# Patient Record
Sex: Female | Born: 1945
Health system: Southern US, Community
[De-identification: ages and names within clinical notes are randomized; demographics above are authoritative.]

## PROBLEM LIST (undated history)

## (undated) DIAGNOSIS — I10 Essential (primary) hypertension: Secondary | ICD-10-CM

## (undated) DIAGNOSIS — E785 Hyperlipidemia, unspecified: Secondary | ICD-10-CM

## (undated) HISTORY — DX: Essential (primary) hypertension: I10

## (undated) HISTORY — DX: Hyperlipidemia, unspecified: E78.5

---

## 1989-05-13 HISTORY — PX: ABDOMINAL HYSTERECTOMY: SHX81

## 2014-06-16 DIAGNOSIS — I10 Essential (primary) hypertension: Secondary | ICD-10-CM | POA: Diagnosis not present

## 2014-08-15 DIAGNOSIS — Z1231 Encounter for screening mammogram for malignant neoplasm of breast: Secondary | ICD-10-CM | POA: Diagnosis not present

## 2015-02-20 DIAGNOSIS — I10 Essential (primary) hypertension: Secondary | ICD-10-CM | POA: Diagnosis not present

## 2015-03-15 DIAGNOSIS — H2513 Age-related nuclear cataract, bilateral: Secondary | ICD-10-CM | POA: Diagnosis not present

## 2015-03-15 DIAGNOSIS — H40033 Anatomical narrow angle, bilateral: Secondary | ICD-10-CM | POA: Diagnosis not present

## 2015-03-15 DIAGNOSIS — Z23 Encounter for immunization: Secondary | ICD-10-CM | POA: Diagnosis not present

## 2015-06-22 DIAGNOSIS — E782 Mixed hyperlipidemia: Secondary | ICD-10-CM | POA: Diagnosis not present

## 2015-06-22 DIAGNOSIS — I10 Essential (primary) hypertension: Secondary | ICD-10-CM | POA: Diagnosis not present

## 2015-06-22 DIAGNOSIS — R531 Weakness: Secondary | ICD-10-CM | POA: Diagnosis not present

## 2015-06-22 DIAGNOSIS — Z79899 Other long term (current) drug therapy: Secondary | ICD-10-CM | POA: Diagnosis not present

## 2015-06-22 DIAGNOSIS — E7800 Pure hypercholesterolemia, unspecified: Secondary | ICD-10-CM | POA: Diagnosis not present

## 2015-06-22 DIAGNOSIS — R5381 Other malaise: Secondary | ICD-10-CM | POA: Diagnosis not present

## 2015-08-04 DIAGNOSIS — R300 Dysuria: Secondary | ICD-10-CM | POA: Diagnosis not present

## 2015-08-04 DIAGNOSIS — N3090 Cystitis, unspecified without hematuria: Secondary | ICD-10-CM | POA: Diagnosis not present

## 2015-08-17 DIAGNOSIS — Z1231 Encounter for screening mammogram for malignant neoplasm of breast: Secondary | ICD-10-CM | POA: Diagnosis not present

## 2015-10-18 DIAGNOSIS — I10 Essential (primary) hypertension: Secondary | ICD-10-CM | POA: Diagnosis not present

## 2015-10-26 DIAGNOSIS — N959 Unspecified menopausal and perimenopausal disorder: Secondary | ICD-10-CM | POA: Diagnosis not present

## 2016-02-15 DIAGNOSIS — R531 Weakness: Secondary | ICD-10-CM | POA: Diagnosis not present

## 2016-02-15 DIAGNOSIS — R5381 Other malaise: Secondary | ICD-10-CM | POA: Diagnosis not present

## 2016-02-15 DIAGNOSIS — E785 Hyperlipidemia, unspecified: Secondary | ICD-10-CM | POA: Diagnosis not present

## 2016-02-15 DIAGNOSIS — Z79899 Other long term (current) drug therapy: Secondary | ICD-10-CM | POA: Diagnosis not present

## 2016-02-15 DIAGNOSIS — I10 Essential (primary) hypertension: Secondary | ICD-10-CM | POA: Diagnosis not present

## 2016-03-12 DIAGNOSIS — Z23 Encounter for immunization: Secondary | ICD-10-CM | POA: Diagnosis not present

## 2016-03-13 DIAGNOSIS — H40033 Anatomical narrow angle, bilateral: Secondary | ICD-10-CM | POA: Diagnosis not present

## 2016-03-13 DIAGNOSIS — H2513 Age-related nuclear cataract, bilateral: Secondary | ICD-10-CM | POA: Diagnosis not present

## 2016-03-13 LAB — HM DIABETES EYE EXAM

## 2016-07-26 ENCOUNTER — Ambulatory Visit (INDEPENDENT_AMBULATORY_CARE_PROVIDER_SITE_OTHER): Payer: Medicare Other | Admitting: Family Medicine

## 2016-07-26 ENCOUNTER — Encounter: Payer: Self-pay | Admitting: Family Medicine

## 2016-07-26 DIAGNOSIS — E785 Hyperlipidemia, unspecified: Secondary | ICD-10-CM | POA: Diagnosis not present

## 2016-07-26 DIAGNOSIS — I10 Essential (primary) hypertension: Secondary | ICD-10-CM | POA: Diagnosis not present

## 2016-07-26 MED ORDER — METOPROLOL SUCCINATE ER 25 MG PO TB24: 25.0000 mg | ORAL_TABLET | Freq: Every day | ORAL | 3 refills | Status: DC

## 2016-07-26 NOTE — Progress Notes (Signed)
   HPI  Patient presents today here to establish care.  Patient has hypertension and hyperlipidemia.  Tolerating Lipitor without a problem. She has metoprolol tartrate 25 mg she takes once a day, at times she states that she gets warm and flushing at night that may be the medicine wearing off. She denies any additional palpitations but it was originally started for palpitations which have been well controlled with medication.  She denies any chest pain or dyspnea. She denies any other concerns today.  Her labs were last checked about 4-5 months ago.   PMH: Hyperlipidemia, hypertension Surgical history of abdominal hysterectomy, first partial in 1983, then complete in 1981 Past family history: Hyperlipidemia and hypertension in father, hypertension in mother Social history Married, no alcohol use. Never smoker ROS: Per HPI  Objective: BP (!) 140/94   Pulse 74   Temp 97 F (36.1 C) (Oral)   Ht _0  (1.651 m)   Wt 165 lb 3.2 oz (74.9 kg)   BMI 27.49 kg/m  Gen: NAD, alert, cooperative with exam HEENT: NCAT, EOMI, PERRL CV: RRR, good S1/S2, no murmur Resp: CTABL, no wheezes, non-labored Abd: SNTND, BS present, no guarding or organomegaly Ext: No edema, warm Neuro: Alert and oriented, strength 5/5 and sensation intact in bilateral lower extremities, 1+ symmetric patellar tendon reflexes  Assessment and plan:  # Hypertension Slightly elevated today, patient reports 130s over 80s at home She is currently on metoprolol tartrate only once a day, changing this to succinate for 24-hour coverage. I have confirmed the dose based on the medication bottles she has on hand. Continue HCTZ Labs  # Hyperlipidemia Clinically stable Continue Lipitor Nonfasting, LDL, CMP    Orders Placed This Encounter  Procedures  . CMP14+EGFR  . CBC with Differential/Platelet  . TSH  . LDL Cholesterol, Direct    Meds ordered this encounter  Medications  . hydrochlorothiazide  (HYDRODIURIL) 25 MG tablet    Sig: Take 25 mg by mouth daily.    Refill:  4  . metoprolol (LOPRESSOR) 50 MG tablet    Sig: Take 50 mg by mouth daily.    Refill:  3  . atorvastatin (LIPITOR) 40 MG tablet    Sig: Take 40 mg by mouth daily.    Refill:  3  . Omega-3 Fatty Acids (FISH OIL) 1000 MG CAPS    Sig: Take by mouth.  Marland Kitchen aspirin EC 81 MG tablet    Sig: Take 81 mg by mouth daily.  . Multiple Vitamin (MULTIVITAMIN) tablet    Sig: Take 1 tablet by mouth daily.  . Pyridoxine HCl (VITAMIN B-6 PO)    Sig: Take by mouth.  . Cyanocobalamin (VITAMIN B 12 PO)    Sig: Take by mouth.    Laroy Apple, MD [facility] 07/26/2016, 1:32 PM

## 2016-07-26 NOTE — Patient Instructions (Signed)
Great to meet you!  Lets plan to follow up in 6 months unless you need [place] sooner.

## 2016-07-27 LAB — CMP14+EGFR
ALBUMIN: 4.7 g/dL (ref 3.5–4.8)
ALT: 26 IU/L (ref 0–32)
AST: 36 IU/L (ref 0–40)
Albumin/Globulin Ratio: 1.6 (ref 1.2–2.2)
Alkaline Phosphatase: 90 IU/L (ref 39–117)
BUN / CREAT RATIO: 24 (ref 12–28)
BUN: 17 mg/dL (ref 8–27)
Bilirubin Total: 0.4 mg/dL (ref 0.0–1.2)
CALCIUM: 10.5 mg/dL — AB (ref 8.7–10.3)
CO2: 26 mmol/L (ref 18–29)
CREATININE: 0.71 mg/dL (ref 0.57–1.00)
Chloride: 100 mmol/L (ref 96–106)
GFR, EST AFRICAN AMERICAN: 100 mL/min/{1.73_m2} (ref >59)
GFR, EST NON AFRICAN AMERICAN: 87 mL/min/{1.73_m2} (ref >59)
GLOBULIN, TOTAL: 2.9 g/dL (ref 1.5–4.5)
Glucose: 84 mg/dL (ref 65–99)
Potassium: 5 mmol/L (ref 3.5–5.2)
Sodium: 143 mmol/L (ref 134–144)
Total Protein: 7.6 g/dL (ref 6.0–8.5)

## 2016-07-27 LAB — CBC WITH DIFFERENTIAL/PLATELET
BASOS: 1 %
Basophils Absolute: 0.1 10*3/uL (ref 0.0–0.2)
EOS (ABSOLUTE): 0.1 10*3/uL (ref 0.0–0.4)
EOS: 1 %
HEMATOCRIT: 42.3 % (ref 34.0–46.6)
Hemoglobin: 14.3 g/dL (ref 11.1–15.9)
IMMATURE GRANULOCYTES: 0 %
Immature Grans (Abs): 0 10*3/uL (ref 0.0–0.1)
LYMPHS ABS: 1.5 10*3/uL (ref 0.7–3.1)
Lymphs: 18 %
MCH: 31.4 pg (ref 26.6–33.0)
MCHC: 33.8 g/dL (ref 31.5–35.7)
MCV: 93 fL (ref 79–97)
MONOS ABS: 0.5 10*3/uL (ref 0.1–0.9)
Monocytes: 6 %
Neutrophils Absolute: 6.1 10*3/uL (ref 1.4–7.0)
Neutrophils: 74 %
Platelets: 303 10*3/uL (ref 150–379)
RBC: 4.56 x10E6/uL (ref 3.77–5.28)
RDW: 13 % (ref 12.3–15.4)
WBC: 8.3 10*3/uL (ref 3.4–10.8)

## 2016-07-27 LAB — TSH: TSH: 2.03 u[IU]/mL (ref 0.450–4.500)

## 2016-07-27 LAB — LDL CHOLESTEROL, DIRECT: LDL Direct: 116 mg/dL — ABNORMAL HIGH (ref 0–99)

## 2016-07-29 ENCOUNTER — Telehealth: Payer: Self-pay | Admitting: Family Medicine

## 2016-07-29 NOTE — Telephone Encounter (Signed)
Patient aware of lab results and will work on low fat diet and exercise to bring cholesterol down

## 2016-09-25 ENCOUNTER — Ambulatory Visit (INDEPENDENT_AMBULATORY_CARE_PROVIDER_SITE_OTHER): Payer: Medicare Other | Admitting: Pediatrics

## 2016-09-25 ENCOUNTER — Encounter: Payer: Self-pay | Admitting: Pediatrics

## 2016-09-25 VITALS — BP 124/73 | HR 79 | Temp 97.0°F | Ht 65.0 in | Wt 164.0 lb

## 2016-09-25 DIAGNOSIS — R399 Unspecified symptoms and signs involving the genitourinary system: Secondary | ICD-10-CM

## 2016-09-25 DIAGNOSIS — K5900 Constipation, unspecified: Secondary | ICD-10-CM | POA: Diagnosis not present

## 2016-09-25 DIAGNOSIS — N3090 Cystitis, unspecified without hematuria: Secondary | ICD-10-CM

## 2016-09-25 LAB — URINALYSIS, COMPLETE
Bilirubin, UA: NEGATIVE
GLUCOSE, UA: NEGATIVE
Ketones, UA: NEGATIVE
NITRITE UA: NEGATIVE
Protein, UA: NEGATIVE
Specific Gravity, UA: 1.01 (ref 1.005–1.030)
UUROB: 0.2 mg/dL (ref 0.2–1.0)
pH, UA: 7 (ref 5.0–7.5)

## 2016-09-25 LAB — MICROSCOPIC EXAMINATION

## 2016-09-25 MED ORDER — LINACLOTIDE 72 MCG PO CAPS: 72.0000 ug | ORAL_CAPSULE | Freq: Every day | ORAL | 0 refills | Status: DC

## 2016-09-25 MED ORDER — NITROFURANTOIN MONOHYD MACRO 100 MG PO CAPS: 100.0000 mg | ORAL_CAPSULE | Freq: Two times a day (BID) | ORAL | 0 refills | Status: AC

## 2016-09-25 NOTE — Progress Notes (Signed)
  Subjective:   Patient ID: Charlett Lango, female    DOB: [birth year], 71 y.o.   MRN: [id number] CC: Burning with urination  HPI: Lastacia Solum is a 71 y.o. female presenting for Burning with urination  No fevers, appetite normal Feeling fine other than burning No abd pain or back pain Last UTI was last spring Started symptoms about 3 days ago Dysuria with all voids  Stays constipated Takes magnesium twice a week and has large stool she thinks with that   Relevant past medical, surgical, family and social history reviewed. Allergies and medications reviewed and updated. History  Smoking Status  . Never Smoker  Smokeless Tobacco  . Never Used   ROS: Per HPI   Objective:    BP 124/73   Pulse 79   Temp 97 F (36.1 C) (Oral)   Ht 5\' 5"  (1.651 m)   Wt 164 lb (74.4 kg)   BMI 27.29 kg/m   Wt Readings from Last 3 Encounters:  09/25/16 164 lb (74.4 kg)  07/26/16 165 lb 3.2 oz (74.9 kg)    Gen: NAD, alert, cooperative with exam, NCAT EYES: EOMI, no conjunctival injection, or no icterus CV: NRRR, normal S1/S2, no murmur, distal pulses 2+ b/l Resp: CTABL, no wheezes, normal WOB Abd: +BS, soft, NTND. No CVA tenderness Ext: No edema, warm Neuro: Alert and oriented MSK: normal muscle bulk  Assessment & Plan:  Susanna was seen today for burning with urination.  Diagnoses and all orders for this visit:  UTI symptoms -     Urinalysis, Complete -     Urine culture  Cystitis +UA, f/u urine culture Treat with below -     nitrofurantoin, macrocrystal-monohydrate, (MACROBID) 100 MG capsule; Take 1 capsule (100 mg total) by mouth 2 (two) times daily.  Constipation, unspecified constipation type Taking OTC meds, not regularly Stays hydrated Trial of below -     linaclotide (LINZESS) 72 MCG capsule; Take 1 capsule (72 mcg total) by mouth daily before breakfast.   Follow up plan: As scheduled with PCP Assunta Found, MD [place]

## 2016-09-26 DIAGNOSIS — M9903 Segmental and somatic dysfunction of lumbar region: Secondary | ICD-10-CM | POA: Diagnosis not present

## 2016-09-26 DIAGNOSIS — M5137 Other intervertebral disc degeneration, lumbosacral region: Secondary | ICD-10-CM | POA: Diagnosis not present

## 2016-09-26 DIAGNOSIS — M9904 Segmental and somatic dysfunction of sacral region: Secondary | ICD-10-CM | POA: Diagnosis not present

## 2016-09-26 DIAGNOSIS — M9905 Segmental and somatic dysfunction of pelvic region: Secondary | ICD-10-CM | POA: Diagnosis not present

## 2016-09-27 LAB — URINE CULTURE

## 2016-09-30 ENCOUNTER — Telehealth: Payer: Self-pay | Admitting: Family Medicine

## 2016-10-02 NOTE — Telephone Encounter (Signed)
Pt aware, she is doing better If she starts burning again she will call to make an appt

## 2016-10-02 NOTE — Telephone Encounter (Signed)
Her urine culture was negative for infective bacteria. If she is still having symptoms likely due to something else. She should be seen, we will repeat testing, likely do wet prep as well.

## 2016-10-18 DIAGNOSIS — Z1231 Encounter for screening mammogram for malignant neoplasm of breast: Secondary | ICD-10-CM | POA: Diagnosis not present

## 2016-10-25 ENCOUNTER — Encounter: Payer: Self-pay | Admitting: Pharmacist

## 2016-10-25 DIAGNOSIS — E785 Hyperlipidemia, unspecified: Secondary | ICD-10-CM | POA: Insufficient documentation

## 2016-11-06 DIAGNOSIS — M5137 Other intervertebral disc degeneration, lumbosacral region: Secondary | ICD-10-CM | POA: Diagnosis not present

## 2016-11-06 DIAGNOSIS — M9905 Segmental and somatic dysfunction of pelvic region: Secondary | ICD-10-CM | POA: Diagnosis not present

## 2016-11-06 DIAGNOSIS — M9903 Segmental and somatic dysfunction of lumbar region: Secondary | ICD-10-CM | POA: Diagnosis not present

## 2016-11-06 DIAGNOSIS — M9904 Segmental and somatic dysfunction of sacral region: Secondary | ICD-10-CM | POA: Diagnosis not present

## 2016-11-08 DIAGNOSIS — R399 Unspecified symptoms and signs involving the genitourinary system: Secondary | ICD-10-CM | POA: Diagnosis not present

## 2017-01-28 ENCOUNTER — Encounter: Payer: Self-pay | Admitting: Family Medicine

## 2017-01-28 ENCOUNTER — Ambulatory Visit (INDEPENDENT_AMBULATORY_CARE_PROVIDER_SITE_OTHER): Payer: Medicare Other | Admitting: Family Medicine

## 2017-01-28 VITALS — BP 134/82 | HR 73 | Temp 97.1°F | Ht 65.0 in | Wt 155.8 lb

## 2017-01-28 DIAGNOSIS — I10 Essential (primary) hypertension: Secondary | ICD-10-CM | POA: Diagnosis not present

## 2017-01-28 DIAGNOSIS — E785 Hyperlipidemia, unspecified: Secondary | ICD-10-CM

## 2017-01-28 NOTE — Progress Notes (Signed)
   HPI  Patient presents today here to follow up for chronic medical conditions  HTN Good med compliance  No side effects or medicines No chest pain or headache.  Hyperlipidemia Watching diet moderately, occupationally active but no formal exercise routine Good medication compliance with statin, no side effects  PMH: Smoking status noted ROS: Per HPI  Objective: BP 134/82   Pulse 73   Temp (!) 97.1 F (36.2 C) (Oral)   Ht _0  (1.651 m)   Wt 155 lb 12.8 oz (70.7 kg)   BMI 25.93 kg/m  Gen: NAD, alert, cooperative with exam HEENT: NCAT, EOMI, PERRL CV: RRR, good S1/S2, no murmur Resp: CTABL, no wheezes, non-labored Ext: No edema, warm Neuro: Alert and oriented, No gross deficits  Assessment and plan:  # Hypertension Well-controlled on current meds Continue metoprolol Labs  # Hyperlipidemia No history of ASCVD Continue statin,    Orders Placed This Encounter  Procedures  . CMP14+EGFR  . CBC with Differential/Platelet  . Lipid panel    Laroy Apple, MD [facility] 01/28/2017, 1:12 PM

## 2017-01-29 LAB — CMP14+EGFR
ALBUMIN: 4.7 g/dL (ref 3.5–4.8)
ALT: 27 IU/L (ref 0–32)
AST: 37 IU/L (ref 0–40)
Albumin/Globulin Ratio: 1.9 (ref 1.2–2.2)
Alkaline Phosphatase: 89 IU/L (ref 39–117)
BUN / CREAT RATIO: 20 (ref 12–28)
BUN: 15 mg/dL (ref 8–27)
Bilirubin Total: 0.7 mg/dL (ref 0.0–1.2)
CALCIUM: 9.9 mg/dL (ref 8.7–10.3)
CO2: 27 mmol/L (ref 20–29)
CREATININE: 0.76 mg/dL (ref 0.57–1.00)
Chloride: 98 mmol/L (ref 96–106)
GFR calc Af Amer: 92 mL/min/{1.73_m2} (ref >59)
GFR, EST NON AFRICAN AMERICAN: 80 mL/min/{1.73_m2} (ref >59)
GLUCOSE: 77 mg/dL (ref 65–99)
Globulin, Total: 2.5 g/dL (ref 1.5–4.5)
Potassium: 4.9 mmol/L (ref 3.5–5.2)
Sodium: 139 mmol/L (ref 134–144)
Total Protein: 7.2 g/dL (ref 6.0–8.5)

## 2017-01-29 LAB — CBC WITH DIFFERENTIAL/PLATELET
Basophils Absolute: 0 10*3/uL (ref 0.0–0.2)
Basos: 0 %
EOS (ABSOLUTE): 0.1 10*3/uL (ref 0.0–0.4)
Eos: 1 %
Hematocrit: 40.6 % (ref 34.0–46.6)
Hemoglobin: 13.9 g/dL (ref 11.1–15.9)
IMMATURE GRANULOCYTES: 0 %
Immature Grans (Abs): 0 10*3/uL (ref 0.0–0.1)
Lymphocytes Absolute: 1.5 10*3/uL (ref 0.7–3.1)
Lymphs: 19 %
MCH: 31.3 pg (ref 26.6–33.0)
MCHC: 34.2 g/dL (ref 31.5–35.7)
MCV: 91 fL (ref 79–97)
MONOCYTES: 9 %
Monocytes Absolute: 0.7 10*3/uL (ref 0.1–0.9)
NEUTROS PCT: 71 %
Neutrophils Absolute: 5.6 10*3/uL (ref 1.4–7.0)
PLATELETS: 309 10*3/uL (ref 150–379)
RBC: 4.44 x10E6/uL (ref 3.77–5.28)
RDW: 13.6 % (ref 12.3–15.4)
WBC: 7.9 10*3/uL (ref 3.4–10.8)

## 2017-01-29 LAB — LIPID PANEL
Chol/HDL Ratio: 2.8 ratio (ref 0.0–4.4)
Cholesterol, Total: 188 mg/dL (ref 100–199)
HDL: 68 mg/dL (ref >39)
LDL Calculated: 101 mg/dL — ABNORMAL HIGH (ref 0–99)
TRIGLYCERIDES: 96 mg/dL (ref 0–149)
VLDL Cholesterol Cal: 19 mg/dL (ref 5–40)

## 2017-01-30 ENCOUNTER — Encounter: Payer: Self-pay | Admitting: Family Medicine

## 2017-02-05 DIAGNOSIS — M9904 Segmental and somatic dysfunction of sacral region: Secondary | ICD-10-CM | POA: Diagnosis not present

## 2017-02-05 DIAGNOSIS — M9905 Segmental and somatic dysfunction of pelvic region: Secondary | ICD-10-CM | POA: Diagnosis not present

## 2017-02-05 DIAGNOSIS — M5137 Other intervertebral disc degeneration, lumbosacral region: Secondary | ICD-10-CM | POA: Diagnosis not present

## 2017-02-05 DIAGNOSIS — M9903 Segmental and somatic dysfunction of lumbar region: Secondary | ICD-10-CM | POA: Diagnosis not present

## 2017-02-25 DIAGNOSIS — Z23 Encounter for immunization: Secondary | ICD-10-CM | POA: Diagnosis not present

## 2017-03-06 DIAGNOSIS — M9905 Segmental and somatic dysfunction of pelvic region: Secondary | ICD-10-CM | POA: Diagnosis not present

## 2017-03-06 DIAGNOSIS — M9903 Segmental and somatic dysfunction of lumbar region: Secondary | ICD-10-CM | POA: Diagnosis not present

## 2017-03-06 DIAGNOSIS — M9904 Segmental and somatic dysfunction of sacral region: Secondary | ICD-10-CM | POA: Diagnosis not present

## 2017-03-06 DIAGNOSIS — M5137 Other intervertebral disc degeneration, lumbosacral region: Secondary | ICD-10-CM | POA: Diagnosis not present

## 2017-03-11 DIAGNOSIS — H40033 Anatomical narrow angle, bilateral: Secondary | ICD-10-CM | POA: Diagnosis not present

## 2017-03-11 DIAGNOSIS — H2513 Age-related nuclear cataract, bilateral: Secondary | ICD-10-CM | POA: Diagnosis not present

## 2017-03-17 ENCOUNTER — Encounter: Payer: Self-pay | Admitting: Family Medicine

## 2017-03-17 ENCOUNTER — Ambulatory Visit (INDEPENDENT_AMBULATORY_CARE_PROVIDER_SITE_OTHER): Payer: Medicare Other | Admitting: Family Medicine

## 2017-03-17 VITALS — BP 120/81 | HR 97 | Temp 97.0°F | Ht 65.0 in | Wt 159.2 lb

## 2017-03-17 DIAGNOSIS — R300 Dysuria: Secondary | ICD-10-CM | POA: Diagnosis not present

## 2017-03-17 DIAGNOSIS — N3090 Cystitis, unspecified without hematuria: Secondary | ICD-10-CM | POA: Diagnosis not present

## 2017-03-17 LAB — URINALYSIS
Bilirubin, UA: NEGATIVE
GLUCOSE, UA: NEGATIVE
Ketones, UA: NEGATIVE
Nitrite, UA: NEGATIVE
PROTEIN UA: NEGATIVE
Specific Gravity, UA: 1.01 (ref 1.005–1.030)
Urobilinogen, Ur: 0.2 mg/dL (ref 0.2–1.0)
pH, UA: 6 (ref 5.0–7.5)

## 2017-03-17 MED ORDER — AMOXICILLIN 500 MG PO CAPS: 500.0000 mg | ORAL_CAPSULE | Freq: Three times a day (TID) | ORAL | 0 refills | Status: DC

## 2017-03-17 NOTE — Progress Notes (Signed)
Chief Complaint  Patient presents with  . Urinary Tract Infection    burning with frequency, started yesterday    HPI  Patient presents today for burning with urination and frequency for 2 l days. Denies fever . No flank pain. No nausea, vomiting.   PMH: Smoking status noted ROS: Per HPI  Objective: BP 120/81 (BP Location: Left Arm, Patient Position: Sitting, Cuff Size: Normal)   Pulse 97   Temp (!) 97 F (36.1 C) (Oral)   Ht 5\' 5"  (1.651 m)   Wt 159 lb 3.2 oz (72.2 kg)   BMI 26.49 kg/m  Gen: NAD, alert, cooperative with exam HEENT: NCAT, EOMI CV: RRR, good S1/S2, no murmur Resp: CTABL, no wheezes, non-labored Abd: SNTND, BS present, no guarding or organomegaly Ext: No edema, warm Neuro: Alert and oriented, No gross deficits Urine: 3+ blood 1+ leukocytes nitrite negative Assessment and plan:  1. Cystitis   2. Dysuria     Meds ordered this encounter  Medications  . amoxicillin (AMOXIL) 500 MG capsule    Sig: Take 1 capsule (500 mg total) 3 (three) times daily by mouth.    Dispense:  21 capsule    Refill:  0    Orders Placed This Encounter  Procedures  . Urine Culture  . Urinalysis    Follow up as needed.  Claretta Fraise, MD

## 2017-03-19 ENCOUNTER — Other Ambulatory Visit: Payer: Self-pay | Admitting: Family Medicine

## 2017-03-19 LAB — URINE CULTURE

## 2017-03-19 MED ORDER — AMOXICILLIN-POT CLAVULANATE 500-125 MG PO TABS: 1.0000 | ORAL_TABLET | Freq: Two times a day (BID) | ORAL | 0 refills | Status: AC

## 2017-03-26 DIAGNOSIS — M9905 Segmental and somatic dysfunction of pelvic region: Secondary | ICD-10-CM | POA: Diagnosis not present

## 2017-03-26 DIAGNOSIS — M9904 Segmental and somatic dysfunction of sacral region: Secondary | ICD-10-CM | POA: Diagnosis not present

## 2017-03-26 DIAGNOSIS — M5137 Other intervertebral disc degeneration, lumbosacral region: Secondary | ICD-10-CM | POA: Diagnosis not present

## 2017-03-26 DIAGNOSIS — M9903 Segmental and somatic dysfunction of lumbar region: Secondary | ICD-10-CM | POA: Diagnosis not present

## 2017-04-07 DIAGNOSIS — M9903 Segmental and somatic dysfunction of lumbar region: Secondary | ICD-10-CM | POA: Diagnosis not present

## 2017-04-07 DIAGNOSIS — M9905 Segmental and somatic dysfunction of pelvic region: Secondary | ICD-10-CM | POA: Diagnosis not present

## 2017-04-07 DIAGNOSIS — M9904 Segmental and somatic dysfunction of sacral region: Secondary | ICD-10-CM | POA: Diagnosis not present

## 2017-04-07 DIAGNOSIS — M5137 Other intervertebral disc degeneration, lumbosacral region: Secondary | ICD-10-CM | POA: Diagnosis not present

## 2017-04-08 DIAGNOSIS — M5137 Other intervertebral disc degeneration, lumbosacral region: Secondary | ICD-10-CM | POA: Diagnosis not present

## 2017-04-08 DIAGNOSIS — M9904 Segmental and somatic dysfunction of sacral region: Secondary | ICD-10-CM | POA: Diagnosis not present

## 2017-04-08 DIAGNOSIS — M9905 Segmental and somatic dysfunction of pelvic region: Secondary | ICD-10-CM | POA: Diagnosis not present

## 2017-04-08 DIAGNOSIS — M9903 Segmental and somatic dysfunction of lumbar region: Secondary | ICD-10-CM | POA: Diagnosis not present

## 2017-04-09 DIAGNOSIS — M9905 Segmental and somatic dysfunction of pelvic region: Secondary | ICD-10-CM | POA: Diagnosis not present

## 2017-04-09 DIAGNOSIS — M5137 Other intervertebral disc degeneration, lumbosacral region: Secondary | ICD-10-CM | POA: Diagnosis not present

## 2017-04-09 DIAGNOSIS — M9903 Segmental and somatic dysfunction of lumbar region: Secondary | ICD-10-CM | POA: Diagnosis not present

## 2017-04-09 DIAGNOSIS — M9904 Segmental and somatic dysfunction of sacral region: Secondary | ICD-10-CM | POA: Diagnosis not present

## 2017-04-10 DIAGNOSIS — M9903 Segmental and somatic dysfunction of lumbar region: Secondary | ICD-10-CM | POA: Diagnosis not present

## 2017-04-10 DIAGNOSIS — M9904 Segmental and somatic dysfunction of sacral region: Secondary | ICD-10-CM | POA: Diagnosis not present

## 2017-04-10 DIAGNOSIS — M9905 Segmental and somatic dysfunction of pelvic region: Secondary | ICD-10-CM | POA: Diagnosis not present

## 2017-04-10 DIAGNOSIS — M5137 Other intervertebral disc degeneration, lumbosacral region: Secondary | ICD-10-CM | POA: Diagnosis not present

## 2017-04-15 DIAGNOSIS — M9905 Segmental and somatic dysfunction of pelvic region: Secondary | ICD-10-CM | POA: Diagnosis not present

## 2017-04-15 DIAGNOSIS — M5137 Other intervertebral disc degeneration, lumbosacral region: Secondary | ICD-10-CM | POA: Diagnosis not present

## 2017-04-15 DIAGNOSIS — M9903 Segmental and somatic dysfunction of lumbar region: Secondary | ICD-10-CM | POA: Diagnosis not present

## 2017-04-15 DIAGNOSIS — M9904 Segmental and somatic dysfunction of sacral region: Secondary | ICD-10-CM | POA: Diagnosis not present

## 2017-04-30 DIAGNOSIS — M9903 Segmental and somatic dysfunction of lumbar region: Secondary | ICD-10-CM | POA: Diagnosis not present

## 2017-04-30 DIAGNOSIS — M5137 Other intervertebral disc degeneration, lumbosacral region: Secondary | ICD-10-CM | POA: Diagnosis not present

## 2017-04-30 DIAGNOSIS — M9904 Segmental and somatic dysfunction of sacral region: Secondary | ICD-10-CM | POA: Diagnosis not present

## 2017-04-30 DIAGNOSIS — M9905 Segmental and somatic dysfunction of pelvic region: Secondary | ICD-10-CM | POA: Diagnosis not present

## 2017-05-12 ENCOUNTER — Other Ambulatory Visit: Payer: Self-pay | Admitting: Family Medicine

## 2017-05-28 DIAGNOSIS — M5137 Other intervertebral disc degeneration, lumbosacral region: Secondary | ICD-10-CM | POA: Diagnosis not present

## 2017-05-28 DIAGNOSIS — M9903 Segmental and somatic dysfunction of lumbar region: Secondary | ICD-10-CM | POA: Diagnosis not present

## 2017-05-28 DIAGNOSIS — M9905 Segmental and somatic dysfunction of pelvic region: Secondary | ICD-10-CM | POA: Diagnosis not present

## 2017-05-28 DIAGNOSIS — M9904 Segmental and somatic dysfunction of sacral region: Secondary | ICD-10-CM | POA: Diagnosis not present

## 2017-06-25 DIAGNOSIS — M9905 Segmental and somatic dysfunction of pelvic region: Secondary | ICD-10-CM | POA: Diagnosis not present

## 2017-06-25 DIAGNOSIS — M9904 Segmental and somatic dysfunction of sacral region: Secondary | ICD-10-CM | POA: Diagnosis not present

## 2017-06-25 DIAGNOSIS — M5137 Other intervertebral disc degeneration, lumbosacral region: Secondary | ICD-10-CM | POA: Diagnosis not present

## 2017-06-25 DIAGNOSIS — M9903 Segmental and somatic dysfunction of lumbar region: Secondary | ICD-10-CM | POA: Diagnosis not present

## 2017-07-18 ENCOUNTER — Ambulatory Visit (INDEPENDENT_AMBULATORY_CARE_PROVIDER_SITE_OTHER): Payer: Medicare Other | Admitting: Family Medicine

## 2017-07-18 ENCOUNTER — Encounter: Payer: Self-pay | Admitting: Family Medicine

## 2017-07-18 VITALS — BP 121/81 | HR 73 | Temp 97.3°F | Ht 65.0 in | Wt 159.8 lb

## 2017-07-18 DIAGNOSIS — I10 Essential (primary) hypertension: Secondary | ICD-10-CM

## 2017-07-18 DIAGNOSIS — E785 Hyperlipidemia, unspecified: Secondary | ICD-10-CM

## 2017-07-18 MED ORDER — METOPROLOL SUCCINATE ER 25 MG PO TB24: 25.0000 mg | ORAL_TABLET | Freq: Every day | ORAL | 3 refills | Status: DC

## 2017-07-18 MED ORDER — ATORVASTATIN CALCIUM 40 MG PO TABS: 40.0000 mg | ORAL_TABLET | Freq: Every day | ORAL | 3 refills | Status: DC

## 2017-07-18 MED ORDER — HYDROCHLOROTHIAZIDE 25 MG PO TABS: 25.0000 mg | ORAL_TABLET | Freq: Every day | ORAL | 3 refills | Status: DC

## 2017-07-18 NOTE — Patient Instructions (Signed)
Great to see you!  Come back in 6 months to see Dr. Dettinger 

## 2017-07-18 NOTE — Progress Notes (Signed)
   HPI  Patient presents today here to follow-up for chronic medical conditions.  Hypertension Good medication compliance No headache or chest pain.  Hyperlipidemia Good medication compliance and tolerance with Lipitor.   PMH: Smoking status noted ROS: Per HPI  Objective: BP 121/81   Pulse 73   Temp (!) 97.3 F (36.3 C) (Oral)   Ht '5\' 5"'$  (1.651 m)   Wt 159 lb 12.8 oz (72.5 kg)   BMI 26.59 kg/m  Gen: NAD, alert, cooperative with exam HEENT: NCAT oropharynx moist and clear, CV: RRR, good S1/S2, no murmur Resp: CTABL, no wheezes, non-labored Ext: No edema, warm Neuro: Alert and oriented, No gross deficits  Assessment and plan:  #Hypertension Well-controlled Continue HCTZ and beta-blocker Labs  #Lipidemia Well controlled previously with Lipitor Labs today, nonfasting   Orders Placed This Encounter  Procedures  . CMP14+EGFR  . CBC with Differential/Platelet  . Lipid panel  . TSH    Meds ordered this encounter  Medications  . atorvastatin (LIPITOR) 40 MG tablet    Sig: Take 1 tablet (40 mg total) by mouth daily.    Dispense:  90 tablet    Refill:  3  . hydrochlorothiazide (HYDRODIURIL) 25 MG tablet    Sig: Take 1 tablet (25 mg total) by mouth daily.    Dispense:  90 tablet    Refill:  3  . metoprolol succinate (TOPROL-XL) 25 MG 24 hr tablet    Sig: Take 1 tablet (25 mg total) by mouth daily.    Dispense:  90 tablet    Refill:  [place], MD [facility] 07/18/2017, 10:48 AM

## 2017-07-19 LAB — CBC WITH DIFFERENTIAL/PLATELET
BASOS ABS: 0.1 10*3/uL (ref 0.0–0.2)
Basos: 1 %
EOS (ABSOLUTE): 0.1 10*3/uL (ref 0.0–0.4)
Eos: 2 %
HEMOGLOBIN: 13.6 g/dL (ref 11.1–15.9)
Hematocrit: 41.1 % (ref 34.0–46.6)
IMMATURE GRANS (ABS): 0 10*3/uL (ref 0.0–0.1)
Immature Granulocytes: 0 %
LYMPHS: 20 %
Lymphocytes Absolute: 1.5 10*3/uL (ref 0.7–3.1)
MCH: 31.8 pg (ref 26.6–33.0)
MCHC: 33.1 g/dL (ref 31.5–35.7)
MCV: 96 fL (ref 79–97)
MONOCYTES: 9 %
Monocytes Absolute: 0.6 10*3/uL (ref 0.1–0.9)
NEUTROS PCT: 68 %
Neutrophils Absolute: 5 10*3/uL (ref 1.4–7.0)
PLATELETS: 313 10*3/uL (ref 150–379)
RBC: 4.28 x10E6/uL (ref 3.77–5.28)
RDW: 12.8 % (ref 12.3–15.4)
WBC: 7.2 10*3/uL (ref 3.4–10.8)

## 2017-07-19 LAB — LIPID PANEL
CHOLESTEROL TOTAL: 167 mg/dL (ref 100–199)
Chol/HDL Ratio: 2.5 ratio (ref 0.0–4.4)
HDL: 66 mg/dL (ref >39)
LDL CALC: 85 mg/dL (ref 0–99)
TRIGLYCERIDES: 82 mg/dL (ref 0–149)
VLDL CHOLESTEROL CAL: 16 mg/dL (ref 5–40)

## 2017-07-19 LAB — CMP14+EGFR
A/G RATIO: 1.8 (ref 1.2–2.2)
ALK PHOS: 81 IU/L (ref 39–117)
ALT: 23 IU/L (ref 0–32)
AST: 37 IU/L (ref 0–40)
Albumin: 4.6 g/dL (ref 3.5–4.8)
BILIRUBIN TOTAL: 0.5 mg/dL (ref 0.0–1.2)
BUN/Creatinine Ratio: 20 (ref 12–28)
BUN: 17 mg/dL (ref 8–27)
CHLORIDE: 101 mmol/L (ref 96–106)
CO2: 25 mmol/L (ref 20–29)
Calcium: 9.8 mg/dL (ref 8.7–10.3)
Creatinine, Ser: 0.87 mg/dL (ref 0.57–1.00)
GFR calc Af Amer: 78 mL/min/{1.73_m2} (ref >59)
GFR calc non Af Amer: 67 mL/min/{1.73_m2} (ref >59)
GLUCOSE: 84 mg/dL (ref 65–99)
Globulin, Total: 2.5 g/dL (ref 1.5–4.5)
POTASSIUM: 5.1 mmol/L (ref 3.5–5.2)
Sodium: 142 mmol/L (ref 134–144)
TOTAL PROTEIN: 7.1 g/dL (ref 6.0–8.5)

## 2017-07-19 LAB — TSH: TSH: 3.2 u[IU]/mL (ref 0.450–4.500)

## 2017-07-21 ENCOUNTER — Encounter: Payer: Self-pay | Admitting: Family Medicine

## 2017-07-23 DIAGNOSIS — M9905 Segmental and somatic dysfunction of pelvic region: Secondary | ICD-10-CM | POA: Diagnosis not present

## 2017-07-23 DIAGNOSIS — M9904 Segmental and somatic dysfunction of sacral region: Secondary | ICD-10-CM | POA: Diagnosis not present

## 2017-07-23 DIAGNOSIS — M5137 Other intervertebral disc degeneration, lumbosacral region: Secondary | ICD-10-CM | POA: Diagnosis not present

## 2017-07-23 DIAGNOSIS — M9903 Segmental and somatic dysfunction of lumbar region: Secondary | ICD-10-CM | POA: Diagnosis not present

## 2017-09-03 DIAGNOSIS — M9904 Segmental and somatic dysfunction of sacral region: Secondary | ICD-10-CM | POA: Diagnosis not present

## 2017-09-03 DIAGNOSIS — M9905 Segmental and somatic dysfunction of pelvic region: Secondary | ICD-10-CM | POA: Diagnosis not present

## 2017-09-03 DIAGNOSIS — M5137 Other intervertebral disc degeneration, lumbosacral region: Secondary | ICD-10-CM | POA: Diagnosis not present

## 2017-09-03 DIAGNOSIS — M9903 Segmental and somatic dysfunction of lumbar region: Secondary | ICD-10-CM | POA: Diagnosis not present

## 2017-10-02 DIAGNOSIS — M9903 Segmental and somatic dysfunction of lumbar region: Secondary | ICD-10-CM | POA: Diagnosis not present

## 2017-10-02 DIAGNOSIS — M5137 Other intervertebral disc degeneration, lumbosacral region: Secondary | ICD-10-CM | POA: Diagnosis not present

## 2017-10-02 DIAGNOSIS — M9905 Segmental and somatic dysfunction of pelvic region: Secondary | ICD-10-CM | POA: Diagnosis not present

## 2017-10-02 DIAGNOSIS — M9904 Segmental and somatic dysfunction of sacral region: Secondary | ICD-10-CM | POA: Diagnosis not present

## 2017-10-28 DIAGNOSIS — Z1231 Encounter for screening mammogram for malignant neoplasm of breast: Secondary | ICD-10-CM | POA: Diagnosis not present

## 2017-11-03 DIAGNOSIS — M9905 Segmental and somatic dysfunction of pelvic region: Secondary | ICD-10-CM | POA: Diagnosis not present

## 2017-11-03 DIAGNOSIS — M5137 Other intervertebral disc degeneration, lumbosacral region: Secondary | ICD-10-CM | POA: Diagnosis not present

## 2017-11-03 DIAGNOSIS — M9903 Segmental and somatic dysfunction of lumbar region: Secondary | ICD-10-CM | POA: Diagnosis not present

## 2017-11-03 DIAGNOSIS — M9904 Segmental and somatic dysfunction of sacral region: Secondary | ICD-10-CM | POA: Diagnosis not present

## 2017-12-01 DIAGNOSIS — M9904 Segmental and somatic dysfunction of sacral region: Secondary | ICD-10-CM | POA: Diagnosis not present

## 2017-12-01 DIAGNOSIS — M5137 Other intervertebral disc degeneration, lumbosacral region: Secondary | ICD-10-CM | POA: Diagnosis not present

## 2017-12-01 DIAGNOSIS — M9905 Segmental and somatic dysfunction of pelvic region: Secondary | ICD-10-CM | POA: Diagnosis not present

## 2017-12-01 DIAGNOSIS — M9903 Segmental and somatic dysfunction of lumbar region: Secondary | ICD-10-CM | POA: Diagnosis not present

## 2017-12-18 ENCOUNTER — Other Ambulatory Visit: Payer: Self-pay

## 2017-12-18 ENCOUNTER — Other Ambulatory Visit: Payer: Self-pay | Admitting: Pediatrics

## 2017-12-18 DIAGNOSIS — Z1239 Encounter for other screening for malignant neoplasm of breast: Secondary | ICD-10-CM

## 2017-12-29 DIAGNOSIS — M9905 Segmental and somatic dysfunction of pelvic region: Secondary | ICD-10-CM | POA: Diagnosis not present

## 2017-12-29 DIAGNOSIS — M5137 Other intervertebral disc degeneration, lumbosacral region: Secondary | ICD-10-CM | POA: Diagnosis not present

## 2017-12-29 DIAGNOSIS — M9903 Segmental and somatic dysfunction of lumbar region: Secondary | ICD-10-CM | POA: Diagnosis not present

## 2017-12-29 DIAGNOSIS — M9904 Segmental and somatic dysfunction of sacral region: Secondary | ICD-10-CM | POA: Diagnosis not present

## 2018-01-19 ENCOUNTER — Ambulatory Visit (INDEPENDENT_AMBULATORY_CARE_PROVIDER_SITE_OTHER): Payer: Medicare Other | Admitting: Family Medicine

## 2018-01-19 ENCOUNTER — Encounter: Payer: Self-pay | Admitting: Family Medicine

## 2018-01-19 VITALS — BP 129/78 | HR 67 | Temp 97.1°F | Ht 65.0 in | Wt 162.6 lb

## 2018-01-19 DIAGNOSIS — I10 Essential (primary) hypertension: Secondary | ICD-10-CM | POA: Diagnosis not present

## 2018-01-19 DIAGNOSIS — E785 Hyperlipidemia, unspecified: Secondary | ICD-10-CM

## 2018-01-19 NOTE — Progress Notes (Signed)
BP 129/78   Pulse 67   Temp (!) 97.1 F (36.2 C) (Oral)   Ht 5' 5"  (1.651 m)   Wt 162 lb 9.6 oz (73.8 kg)   BMI 27.06 kg/m    Subjective:    Patient ID: Teresa Quinn, female    DOB: Sep 05, 1945, 72 y.o.   MRN: [id number]  HPI: Teresa Quinn is a 72 y.o. female presenting on 01/19/2018 for Hypertension (6 month follow up); Hyperlipidemia; and Establish Care Wendi Snipes pt)   HPI Hyperlipidemia Patient is coming in for recheck of his hyperlipidemia and fish oil. The patient is currently taking Lipitor. They deny any issues with myalgias or history of liver damage from it. They deny any focal numbness or weakness or chest pain.   Hypertension Patient is currently on hydrochlorothiazide and metoprolol, and their blood pressure today is 129/78. Patient denies any lightheadedness or dizziness. Patient denies headaches, blurred vision, chest pains, shortness of breath, or weakness. Denies any side effects from medication and is content with current medication.   Relevant past medical, surgical, family and social history reviewed and updated as indicated. Interim medical history since our last visit reviewed. Allergies and medications reviewed and updated.  Review of Systems  Constitutional: Negative for chills and fever.  Eyes: Negative for visual disturbance.  Respiratory: Negative for chest tightness and shortness of breath.   Cardiovascular: Negative for chest pain and leg swelling.  Musculoskeletal: Negative for back pain and gait problem.  Skin: Negative for rash.  Neurological: Negative for dizziness, light-headedness and headaches.  Psychiatric/Behavioral: Negative for agitation and behavioral problems.  All other systems reviewed and are negative.   Per HPI unless specifically indicated above   Allergies as of 01/19/2018      Reactions   Sulfur Rash      Medication List        Accurate as of 01/19/18 11:34 AM. Always use your most recent med list.            aspirin EC 81 MG tablet Take 81 mg by mouth daily.   atorvastatin 40 MG tablet Commonly known as:  LIPITOR Take 1 tablet (40 mg total) by mouth daily.   Fish Oil 1000 MG Caps Take by mouth.   hydrochlorothiazide 25 MG tablet Commonly known as:  HYDRODIURIL Take 1 tablet (25 mg total) by mouth daily.   metoprolol succinate 25 MG 24 hr tablet Commonly known as:  TOPROL-XL Take 1 tablet (25 mg total) by mouth daily.   multivitamin tablet Take 1 tablet by mouth daily.   VITAMIN B 12 PO Take by mouth.   VITAMIN B-6 PO Take by mouth.          Objective:    BP 129/78   Pulse 67   Temp (!) 97.1 F (36.2 C) (Oral)   Ht 5' 5"  (1.651 m)   Wt 162 lb 9.6 oz (73.8 kg)   BMI 27.06 kg/m   Wt Readings from Last 3 Encounters:  01/19/18 162 lb 9.6 oz (73.8 kg)  07/18/17 159 lb 12.8 oz (72.5 kg)  03/17/17 159 lb 3.2 oz (72.2 kg)    Physical Exam  Constitutional: She is oriented to person, place, and time. She appears well-developed and well-nourished. No distress.  Eyes: Conjunctivae are normal.  Neck: Neck supple. No thyromegaly present.  Cardiovascular: Normal rate, regular rhythm, normal heart sounds and intact distal pulses.  No murmur heard. Pulmonary/Chest: Effort normal and breath sounds normal. No respiratory distress. She has no  wheezes.  Musculoskeletal: Normal range of motion. She exhibits no edema.  Lymphadenopathy:    She has no cervical adenopathy.  Neurological: She is alert and oriented to person, place, and time. Coordination normal.  Skin: Skin is warm and dry. No rash noted. She is not diaphoretic.  Psychiatric: She has a normal mood and affect. Her behavior is normal.  Nursing note and vitals reviewed.       Assessment & Plan:   Problem List Items Addressed This Visit      Cardiovascular and Mediastinum   Essential hypertension   Relevant Orders   CMP14+EGFR     Other   Hyperlipidemia - Primary   Relevant Orders   Lipid panel        Follow up plan: Return in about 6 months (around 07/20/2018), or if symptoms worsen or fail to improve, for Hyperlipidemia hypertension.  Counseling provided for all of the vaccine components Orders Placed This Encounter  Procedures  . CMP14+EGFR  . Lipid panel    Caryl Pina, MD [facility] 01/19/2018, 11:34 AM

## 2018-01-20 LAB — CMP14+EGFR
A/G RATIO: 1.6 (ref 1.2–2.2)
ALBUMIN: 4.4 g/dL (ref 3.5–4.8)
ALT: 20 IU/L (ref 0–32)
AST: 28 IU/L (ref 0–40)
Alkaline Phosphatase: 79 IU/L (ref 39–117)
BUN / CREAT RATIO: 24 (ref 12–28)
BUN: 19 mg/dL (ref 8–27)
Bilirubin Total: 0.5 mg/dL (ref 0.0–1.2)
CO2: 23 mmol/L (ref 20–29)
Calcium: 10.3 mg/dL (ref 8.7–10.3)
Chloride: 102 mmol/L (ref 96–106)
Creatinine, Ser: 0.79 mg/dL (ref 0.57–1.00)
GFR, EST AFRICAN AMERICAN: 87 mL/min/{1.73_m2} (ref >59)
GFR, EST NON AFRICAN AMERICAN: 76 mL/min/{1.73_m2} (ref >59)
GLOBULIN, TOTAL: 2.8 g/dL (ref 1.5–4.5)
Glucose: 85 mg/dL (ref 65–99)
POTASSIUM: 4.8 mmol/L (ref 3.5–5.2)
SODIUM: 142 mmol/L (ref 134–144)
TOTAL PROTEIN: 7.2 g/dL (ref 6.0–8.5)

## 2018-01-20 LAB — LIPID PANEL
CHOL/HDL RATIO: 2.5 ratio (ref 0.0–4.4)
Cholesterol, Total: 171 mg/dL (ref 100–199)
HDL: 68 mg/dL (ref >39)
LDL CALC: 87 mg/dL (ref 0–99)
Triglycerides: 79 mg/dL (ref 0–149)
VLDL Cholesterol Cal: 16 mg/dL (ref 5–40)

## 2018-01-26 DIAGNOSIS — M9903 Segmental and somatic dysfunction of lumbar region: Secondary | ICD-10-CM | POA: Diagnosis not present

## 2018-01-26 DIAGNOSIS — M9904 Segmental and somatic dysfunction of sacral region: Secondary | ICD-10-CM | POA: Diagnosis not present

## 2018-01-26 DIAGNOSIS — M9905 Segmental and somatic dysfunction of pelvic region: Secondary | ICD-10-CM | POA: Diagnosis not present

## 2018-01-26 DIAGNOSIS — M5137 Other intervertebral disc degeneration, lumbosacral region: Secondary | ICD-10-CM | POA: Diagnosis not present

## 2018-02-23 DIAGNOSIS — M5137 Other intervertebral disc degeneration, lumbosacral region: Secondary | ICD-10-CM | POA: Diagnosis not present

## 2018-02-23 DIAGNOSIS — M9905 Segmental and somatic dysfunction of pelvic region: Secondary | ICD-10-CM | POA: Diagnosis not present

## 2018-02-23 DIAGNOSIS — M9904 Segmental and somatic dysfunction of sacral region: Secondary | ICD-10-CM | POA: Diagnosis not present

## 2018-02-23 DIAGNOSIS — M9903 Segmental and somatic dysfunction of lumbar region: Secondary | ICD-10-CM | POA: Diagnosis not present

## 2018-02-24 ENCOUNTER — Ambulatory Visit (INDEPENDENT_AMBULATORY_CARE_PROVIDER_SITE_OTHER): Payer: Medicare Other

## 2018-02-24 DIAGNOSIS — Z23 Encounter for immunization: Secondary | ICD-10-CM

## 2018-03-23 DIAGNOSIS — M5137 Other intervertebral disc degeneration, lumbosacral region: Secondary | ICD-10-CM | POA: Diagnosis not present

## 2018-03-23 DIAGNOSIS — M9903 Segmental and somatic dysfunction of lumbar region: Secondary | ICD-10-CM | POA: Diagnosis not present

## 2018-03-23 DIAGNOSIS — M9905 Segmental and somatic dysfunction of pelvic region: Secondary | ICD-10-CM | POA: Diagnosis not present

## 2018-03-23 DIAGNOSIS — M9904 Segmental and somatic dysfunction of sacral region: Secondary | ICD-10-CM | POA: Diagnosis not present

## 2018-05-11 DIAGNOSIS — H04123 Dry eye syndrome of bilateral lacrimal glands: Secondary | ICD-10-CM | POA: Diagnosis not present

## 2018-05-11 DIAGNOSIS — H40033 Anatomical narrow angle, bilateral: Secondary | ICD-10-CM | POA: Diagnosis not present

## 2018-07-02 ENCOUNTER — Encounter: Payer: Self-pay | Admitting: Family

## 2018-07-02 ENCOUNTER — Ambulatory Visit (INDEPENDENT_AMBULATORY_CARE_PROVIDER_SITE_OTHER): Payer: Medicare Other | Admitting: Family

## 2018-07-02 VITALS — BP 129/79 | HR 109 | Temp 98.2°F | Ht 65.0 in | Wt 163.2 lb

## 2018-07-02 DIAGNOSIS — B9689 Other specified bacterial agents as the cause of diseases classified elsewhere: Secondary | ICD-10-CM

## 2018-07-02 DIAGNOSIS — J208 Acute bronchitis due to other specified organisms: Secondary | ICD-10-CM

## 2018-07-02 MED ORDER — AZITHROMYCIN 250 MG PO TABS: ORAL_TABLET | ORAL | 0 refills | Status: DC

## 2018-07-02 MED ORDER — PREDNISONE 10 MG (21) PO TBPK: ORAL_TABLET | ORAL | 0 refills | Status: DC

## 2018-07-02 NOTE — Patient Instructions (Signed)

## 2018-07-02 NOTE — Progress Notes (Signed)
Subjective:    Patient ID: Teresa Quinn, female    DOB: January 28, 1946, 73 y.o.   MRN: [id number]  Chief Complaint  Patient presents with  . deep cough    began saturday    Cough  This is a new problem. The current episode started in the past 7 days. The problem has been gradually improving. The problem occurs every few minutes. The cough is non-productive. Associated symptoms include nasal congestion, postnasal drip and wheezing. Pertinent negatives include no chills, ear congestion, ear pain, fever, headaches, myalgias or shortness of breath. The symptoms are aggravated by lying down. She has tried rest and OTC cough suppressant for the symptoms. The treatment provided mild relief.      Review of Systems  Constitutional: Negative for chills and fever.  HENT: Positive for postnasal drip. Negative for ear pain.   Respiratory: Positive for cough and wheezing. Negative for shortness of breath.   Musculoskeletal: Negative for myalgias.  Neurological: Negative for headaches.  All other systems reviewed and are negative.      Objective:   Physical Exam Vitals signs reviewed.  Constitutional:      General: She is not in acute distress.    Appearance: She is well-developed.  HENT:     Head: Normocephalic and atraumatic.     Right Ear: Tympanic membrane normal.     Left Ear: Tympanic membrane normal.  Eyes:     Pupils: Pupils are equal, round, and reactive to light.  Neck:     Musculoskeletal: Normal range of motion and neck supple.     Thyroid: No thyromegaly.  Cardiovascular:     Rate and Rhythm: Normal rate and regular rhythm.     Heart sounds: Normal heart sounds. No murmur.  Pulmonary:     Effort: Pulmonary effort is normal. No respiratory distress.     Breath sounds: Normal breath sounds. No wheezing.     Comments: Intermittent barking nonproductive cough Abdominal:     General: Bowel sounds are normal. There is no distension.     Palpations: Abdomen is soft.   Tenderness: There is no abdominal tenderness.  Musculoskeletal: Normal range of motion.        General: No tenderness.  Skin:    General: Skin is warm and dry.  Neurological:     Mental Status: She is alert and oriented to person, place, and time.     Cranial Nerves: No cranial nerve deficit.     Deep Tendon Reflexes: Reflexes are normal and symmetric.  Psychiatric:        Behavior: Behavior normal.        Thought Content: Thought content normal.        Judgment: Judgment normal.       BP 129/79   Pulse (!) 109   Temp 98.2 F (36.8 C) (Oral)   Ht 5\' 5"  (1.651 m)   Wt 163 lb 3.2 oz (74 kg)   BMI 27.16 kg/m      Assessment & Plan:  [name] comes in today with chief complaint of deep cough (began saturday)   Diagnosis and orders addressed:  1. Acute bacterial bronchitis - Take meds as prescribed - Use a cool mist humidifier  -Use saline nose sprays frequently -Force fluids -For any cough or congestion  Use plain Mucinex- regular strength or max strength is fine -For fever or aces or pains- take tylenol or ibuprofen. -Throat lozenges if help -New toothbrush in 3 days RTO if symptoms worsen  or do not improve  - azithromycin (ZITHROMAX) 250 MG tablet; Take 500 mg once, then 250 mg for four days  Dispense: 6 tablet; Refill: 0 - predniSONE (STERAPRED UNI-PAK 21 TAB) 10 MG (21) TBPK tablet; Use as directed  Dispense: 21 tablet; Refill: 0   Evelina Dun, FNP

## 2018-07-07 ENCOUNTER — Other Ambulatory Visit: Payer: Self-pay | Admitting: *Deleted

## 2018-07-07 MED ORDER — ATORVASTATIN CALCIUM 40 MG PO TABS: 40.0000 mg | ORAL_TABLET | Freq: Every day | ORAL | 0 refills | Status: DC

## 2018-07-20 ENCOUNTER — Ambulatory Visit (INDEPENDENT_AMBULATORY_CARE_PROVIDER_SITE_OTHER): Payer: Medicare Other | Admitting: Family Medicine

## 2018-07-20 ENCOUNTER — Encounter: Payer: Self-pay | Admitting: Family Medicine

## 2018-07-20 ENCOUNTER — Ambulatory Visit (INDEPENDENT_AMBULATORY_CARE_PROVIDER_SITE_OTHER): Payer: Medicare Other

## 2018-07-20 VITALS — BP 123/76 | HR 85 | Temp 97.5°F | Ht 65.0 in | Wt 159.0 lb

## 2018-07-20 DIAGNOSIS — Z780 Asymptomatic menopausal state: Secondary | ICD-10-CM | POA: Diagnosis not present

## 2018-07-20 DIAGNOSIS — E782 Mixed hyperlipidemia: Secondary | ICD-10-CM | POA: Diagnosis not present

## 2018-07-20 DIAGNOSIS — I10 Essential (primary) hypertension: Secondary | ICD-10-CM

## 2018-07-20 DIAGNOSIS — M8589 Other specified disorders of bone density and structure, multiple sites: Secondary | ICD-10-CM | POA: Diagnosis not present

## 2018-07-20 MED ORDER — HYDROCHLOROTHIAZIDE 25 MG PO TABS: 25.0000 mg | ORAL_TABLET | Freq: Every day | ORAL | 3 refills | Status: DC

## 2018-07-20 MED ORDER — METOPROLOL SUCCINATE ER 25 MG PO TB24: 25.0000 mg | ORAL_TABLET | Freq: Every day | ORAL | 3 refills | Status: DC

## 2018-07-20 MED ORDER — ATORVASTATIN CALCIUM 40 MG PO TABS: 40.0000 mg | ORAL_TABLET | Freq: Every day | ORAL | 3 refills | Status: DC

## 2018-07-20 NOTE — Progress Notes (Signed)
BP 123/76   Pulse 85   Temp (!) 97.5 F (36.4 C) (Oral)   Ht _0  (1.651 m)   Wt 159 lb (72.1 kg)   BMI 26.46 kg/m    Subjective:    Patient ID: Teresa Quinn, female    DOB: 09/29/1945, 73 y.o.   MRN: [id number]  HPI: Teresa Quinn is a 73 y.o. female presenting on 07/20/2018 for Hypertension (6 month follow up) and Hyperlipidemia   HPI Hyperlipidemia Patient is coming in for recheck of his hyperlipidemia. The patient is currently taking Lipitor and fish oil. They deny any issues with myalgias or history of liver damage from it. They deny any focal numbness or weakness or chest pain.   Hypertension Patient is currently on metoprolol and hydrochlorothiazide, and their blood pressure today is 123/76. Patient denies any lightheadedness or dizziness. Patient denies headaches, blurred vision, chest pains, shortness of breath, or weakness. Denies any side effects from medication and is content with current medication.   Patient is postmenopausal is been at least a few years since she had a bone density scan and it was not in our office and we do not have the direct results right now, we will do one today here in the office.  Relevant past medical, surgical, family and social history reviewed and updated as indicated. Interim medical history since our last visit reviewed. Allergies and medications reviewed and updated.  Review of Systems  Constitutional: Negative for chills and fever.  HENT: Negative for congestion, ear discharge and ear pain.   Eyes: Negative for redness and visual disturbance.  Respiratory: Negative for chest tightness and shortness of breath.   Cardiovascular: Negative for chest pain and leg swelling.  Musculoskeletal: Negative for back pain and gait problem.  Skin: Negative for rash.  Neurological: Negative for light-headedness and headaches.  Psychiatric/Behavioral: Negative for agitation and behavioral problems.  All other systems reviewed and are  negative.   Per HPI unless specifically indicated above   Allergies as of 07/20/2018      Reactions   Sulfur Rash      Medication List       Accurate as of July 20, 2018 11:54 AM. Always use your most recent med list.        aspirin EC 81 MG tablet Take 81 mg by mouth daily.   atorvastatin 40 MG tablet Commonly known as:  LIPITOR Take 1 tablet (40 mg total) by mouth daily.   Fish Oil 1000 MG Caps Take by mouth.   hydrochlorothiazide 25 MG tablet Commonly known as:  HYDRODIURIL Take 1 tablet (25 mg total) by mouth daily.   metoprolol succinate 25 MG 24 hr tablet Commonly known as:  TOPROL-XL Take 1 tablet (25 mg total) by mouth daily.   multivitamin tablet Take 1 tablet by mouth daily.   VITAMIN B 12 PO Take by mouth.   VITAMIN B-6 PO Take by mouth.          Objective:    BP 123/76   Pulse 85   Temp (!) 97.5 F (36.4 C) (Oral)   Ht _1  (1.651 m)   Wt 159 lb (72.1 kg)   BMI 26.46 kg/m   Wt Readings from Last 3 Encounters:  07/20/18 159 lb (72.1 kg)  07/02/18 163 lb 3.2 oz (74 kg)  01/19/18 162 lb 9.6 oz (73.8 kg)    Physical Exam Vitals signs and nursing note reviewed.  Constitutional:      General: She  is not in acute distress.    Appearance: She is well-developed. She is not diaphoretic.  Eyes:     Conjunctiva/sclera: Conjunctivae normal.  Cardiovascular:     Rate and Rhythm: Normal rate and regular rhythm.     Heart sounds: Normal heart sounds. No murmur.  Pulmonary:     Effort: Pulmonary effort is normal. No respiratory distress.     Breath sounds: Normal breath sounds. No wheezing.  Musculoskeletal: Normal range of motion.        General: No tenderness.  Skin:    General: Skin is warm and dry.     Findings: No rash.  Neurological:     Mental Status: She is alert and oriented to person, place, and time.     Coordination: Coordination normal.  Psychiatric:        Behavior: Behavior normal.         Assessment & Plan:    Problem List Items Addressed This Visit      Cardiovascular and Mediastinum   Essential hypertension   Relevant Medications   atorvastatin (LIPITOR) 40 MG tablet   hydrochlorothiazide (HYDRODIURIL) 25 MG tablet   metoprolol succinate (TOPROL-XL) 25 MG 24 hr tablet   Other Relevant Orders   CMP14+EGFR     Other   Hyperlipidemia - Primary   Relevant Medications   atorvastatin (LIPITOR) 40 MG tablet   hydrochlorothiazide (HYDRODIURIL) 25 MG tablet   metoprolol succinate (TOPROL-XL) 25 MG 24 hr tablet   Other Relevant Orders   Lipid panel    Other Visit Diagnoses    Postmenopausal       Relevant Orders   DG [facility] DEXA       Follow up plan: Return in about 6 months (around 01/20/2019), or if symptoms worsen or fail to improve, for Hypertension and cholesterol.  Counseling provided for all of the vaccine components Orders Placed This Encounter  Procedures  . DG [facility] DEXA  . CMP14+EGFR  . Lipid panel    Caryl Pina, MD [facility] 07/20/2018, 11:54 AM

## 2018-07-21 LAB — CMP14+EGFR
A/G RATIO: 1.8 (ref 1.2–2.2)
ALT: 26 IU/L (ref 0–32)
AST: 33 IU/L (ref 0–40)
Albumin: 4.2 g/dL (ref 3.7–4.7)
Alkaline Phosphatase: 82 IU/L (ref 39–117)
BUN/Creatinine Ratio: 16 (ref 12–28)
BUN: 12 mg/dL (ref 8–27)
Bilirubin Total: 0.7 mg/dL (ref 0.0–1.2)
CALCIUM: 10.1 mg/dL (ref 8.7–10.3)
CO2: 25 mmol/L (ref 20–29)
Chloride: 99 mmol/L (ref 96–106)
Creatinine, Ser: 0.74 mg/dL (ref 0.57–1.00)
GFR calc Af Amer: 94 mL/min/{1.73_m2} (ref >59)
GFR calc non Af Amer: 81 mL/min/{1.73_m2} (ref >59)
Globulin, Total: 2.4 g/dL (ref 1.5–4.5)
Glucose: 80 mg/dL (ref 65–99)
Potassium: 5.1 mmol/L (ref 3.5–5.2)
Sodium: 141 mmol/L (ref 134–144)
Total Protein: 6.6 g/dL (ref 6.0–8.5)

## 2018-07-21 LAB — LIPID PANEL
Chol/HDL Ratio: 2.8 ratio (ref 0.0–4.4)
Cholesterol, Total: 165 mg/dL (ref 100–199)
HDL: 58 mg/dL (ref >39)
LDL Calculated: 93 mg/dL (ref 0–99)
Triglycerides: 70 mg/dL (ref 0–149)
VLDL Cholesterol Cal: 14 mg/dL (ref 5–40)

## 2018-07-23 DIAGNOSIS — M9903 Segmental and somatic dysfunction of lumbar region: Secondary | ICD-10-CM | POA: Diagnosis not present

## 2018-07-23 DIAGNOSIS — M5137 Other intervertebral disc degeneration, lumbosacral region: Secondary | ICD-10-CM | POA: Diagnosis not present

## 2018-07-23 DIAGNOSIS — M9905 Segmental and somatic dysfunction of pelvic region: Secondary | ICD-10-CM | POA: Diagnosis not present

## 2018-07-23 DIAGNOSIS — M9904 Segmental and somatic dysfunction of sacral region: Secondary | ICD-10-CM | POA: Diagnosis not present

## 2018-07-29 DIAGNOSIS — M9905 Segmental and somatic dysfunction of pelvic region: Secondary | ICD-10-CM | POA: Diagnosis not present

## 2018-07-29 DIAGNOSIS — M5137 Other intervertebral disc degeneration, lumbosacral region: Secondary | ICD-10-CM | POA: Diagnosis not present

## 2018-07-29 DIAGNOSIS — M9903 Segmental and somatic dysfunction of lumbar region: Secondary | ICD-10-CM | POA: Diagnosis not present

## 2018-07-29 DIAGNOSIS — M9904 Segmental and somatic dysfunction of sacral region: Secondary | ICD-10-CM | POA: Diagnosis not present

## 2018-09-16 ENCOUNTER — Ambulatory Visit: Payer: Medicare Other | Admitting: *Deleted

## 2018-10-23 ENCOUNTER — Ambulatory Visit: Payer: Medicare Other | Admitting: *Deleted

## 2018-10-23 ENCOUNTER — Other Ambulatory Visit: Payer: Self-pay

## 2018-11-04 DIAGNOSIS — M9904 Segmental and somatic dysfunction of sacral region: Secondary | ICD-10-CM | POA: Diagnosis not present

## 2018-11-04 DIAGNOSIS — M9903 Segmental and somatic dysfunction of lumbar region: Secondary | ICD-10-CM | POA: Diagnosis not present

## 2018-11-04 DIAGNOSIS — M5137 Other intervertebral disc degeneration, lumbosacral region: Secondary | ICD-10-CM | POA: Diagnosis not present

## 2018-11-04 DIAGNOSIS — M9905 Segmental and somatic dysfunction of pelvic region: Secondary | ICD-10-CM | POA: Diagnosis not present

## 2019-01-04 DIAGNOSIS — Z1231 Encounter for screening mammogram for malignant neoplasm of breast: Secondary | ICD-10-CM | POA: Diagnosis not present

## 2019-01-19 ENCOUNTER — Other Ambulatory Visit: Payer: Self-pay

## 2019-01-20 ENCOUNTER — Other Ambulatory Visit: Payer: Self-pay

## 2019-01-20 ENCOUNTER — Encounter: Payer: Self-pay | Admitting: Family Medicine

## 2019-01-20 ENCOUNTER — Ambulatory Visit (INDEPENDENT_AMBULATORY_CARE_PROVIDER_SITE_OTHER): Payer: Medicare Other | Admitting: Family Medicine

## 2019-01-20 VITALS — BP 144/85 | HR 79 | Temp 96.6°F | Ht 65.0 in | Wt 164.6 lb

## 2019-01-20 DIAGNOSIS — I10 Essential (primary) hypertension: Secondary | ICD-10-CM | POA: Diagnosis not present

## 2019-01-20 DIAGNOSIS — E782 Mixed hyperlipidemia: Secondary | ICD-10-CM | POA: Diagnosis not present

## 2019-01-20 DIAGNOSIS — Z23 Encounter for immunization: Secondary | ICD-10-CM | POA: Diagnosis not present

## 2019-01-20 NOTE — Progress Notes (Signed)
BP (!) 144/85   Pulse 79   Temp (!) 96.6 F (35.9 C) (Temporal)   Ht 5' 5"  (1.651 m)   Wt 164 lb 9.6 oz (74.7 kg)   SpO2 98%   BMI 27.39 kg/m    Subjective:   Patient ID: Teresa Quinn, female    DOB: Mar 08, 1946, 73 y.o.   MRN: [id number]  HPI: Teresa Quinn is a 73 y.o. female presenting on 01/20/2019 for Hyperlipidemia (6 month follow up)   HPI Hyperlipidemia Patient is coming in for recheck of his hyperlipidemia. The patient is currently taking atorvastatin. They deny any issues with myalgias or history of liver damage from it. They deny any focal numbness or weakness or chest pain.   Hypertension Patient is currently on metoprolol and HCTZ, and their blood pressure today is 144/85. Patient denies any lightheadedness or dizziness. Patient denies headaches, blurred vision, chest pains, shortness of breath, or weakness. Denies any side effects from medication and is content with current medication.   Relevant past medical, surgical, family and social history reviewed and updated as indicated. Interim medical history since our last visit reviewed. Allergies and medications reviewed and updated.  Review of Systems  Constitutional: Negative for chills and fever.  Eyes: Negative for visual disturbance.  Respiratory: Negative for chest tightness and shortness of breath.   Cardiovascular: Negative for chest pain and leg swelling.  Musculoskeletal: Negative for back pain and gait problem.  Skin: Negative for rash.  Neurological: Negative for light-headedness and headaches.  Psychiatric/Behavioral: Negative for agitation and behavioral problems.  All other systems reviewed and are negative.   Per HPI unless specifically indicated above   Allergies as of 01/20/2019      Reactions   Sulfur Rash      Medication List       Accurate as of January 20, 2019  1:27 PM. If you have any questions, ask your nurse or doctor.        aspirin EC 81 MG tablet Take 81 mg by  mouth daily.   atorvastatin 40 MG tablet Commonly known as: LIPITOR Take 1 tablet (40 mg total) by mouth daily.   Fish Oil 1000 MG Caps Take by mouth.   hydrochlorothiazide 25 MG tablet Commonly known as: HYDRODIURIL Take 1 tablet (25 mg total) by mouth daily.   metoprolol succinate 25 MG 24 hr tablet Commonly known as: TOPROL-XL Take 1 tablet (25 mg total) by mouth daily.   multivitamin tablet Take 1 tablet by mouth daily.   VITAMIN B 12 PO Take by mouth.   VITAMIN B-6 PO Take by mouth.        Objective:   BP (!) 144/85   Pulse 79   Temp (!) 96.6 F (35.9 C) (Temporal)   Ht 5' 5"  (1.651 m)   Wt 164 lb 9.6 oz (74.7 kg)   SpO2 98%   BMI 27.39 kg/m   Wt Readings from Last 3 Encounters:  01/20/19 164 lb 9.6 oz (74.7 kg)  07/20/18 159 lb (72.1 kg)  07/02/18 163 lb 3.2 oz (74 kg)    Physical Exam Vitals signs and nursing note reviewed.  Constitutional:      General: She is not in acute distress.    Appearance: She is well-developed. She is not diaphoretic.  Eyes:     Conjunctiva/sclera: Conjunctivae normal.  Cardiovascular:     Rate and Rhythm: Normal rate and regular rhythm.     Heart sounds: Normal heart sounds. No murmur.  Pulmonary:     Effort: Pulmonary effort is normal. No respiratory distress.     Breath sounds: Normal breath sounds. No wheezing.  Skin:    General: Skin is warm and dry.     Findings: No rash.  Neurological:     Mental Status: She is alert and oriented to person, place, and time.     Coordination: Coordination normal.  Psychiatric:        Behavior: Behavior normal.       Assessment & Plan:   Problem List Items Addressed This Visit      Cardiovascular and Mediastinum   Essential hypertension   Relevant Orders   CBC with Differential/Platelet   CMP14+EGFR   Lipid panel     Other   Hyperlipidemia - Primary   Relevant Orders   CBC with Differential/Platelet   CMP14+EGFR   Lipid panel      No changes in current  medication, will get vaccinations today and continue to monitor blood pressure closely and follow-up in 6 months Follow up plan: Return in about 6 months (around 07/20/2019), or if symptoms worsen or fail to improve, for Hypertension and cholesterol.  Counseling provided for all of the vaccine components Orders Placed This Encounter  Procedures  . CBC with Differential/Platelet  . CMP14+EGFR  . Lipid panel    Caryl Pina, MD [facility] 01/20/2019, 1:27 PM

## 2019-01-20 NOTE — Addendum Note (Signed)
Addended by: Nigel Berthold C on: 01/20/2019 01:53 PM   Modules accepted: Orders

## 2019-01-21 LAB — CBC WITH DIFFERENTIAL/PLATELET
Basophils Absolute: 0.1 10*3/uL (ref 0.0–0.2)
Basos: 1 %
EOS (ABSOLUTE): 0.1 10*3/uL (ref 0.0–0.4)
Eos: 1 %
Hematocrit: 41.3 % (ref 34.0–46.6)
Hemoglobin: 13.9 g/dL (ref 11.1–15.9)
Immature Grans (Abs): 0 10*3/uL (ref 0.0–0.1)
Immature Granulocytes: 0 %
Lymphocytes Absolute: 1.8 10*3/uL (ref 0.7–3.1)
Lymphs: 22 %
MCH: 30.3 pg (ref 26.6–33.0)
MCHC: 33.7 g/dL (ref 31.5–35.7)
MCV: 90 fL (ref 79–97)
Monocytes Absolute: 0.7 10*3/uL (ref 0.1–0.9)
Monocytes: 8 %
Neutrophils Absolute: 5.5 10*3/uL (ref 1.4–7.0)
Neutrophils: 68 %
Platelets: 318 10*3/uL (ref 150–450)
RBC: 4.59 x10E6/uL (ref 3.77–5.28)
RDW: 12 % (ref 11.7–15.4)
WBC: 8.2 10*3/uL (ref 3.4–10.8)

## 2019-01-21 LAB — CMP14+EGFR
ALT: 27 IU/L (ref 0–32)
AST: 35 IU/L (ref 0–40)
Albumin/Globulin Ratio: 2 (ref 1.2–2.2)
Albumin: 4.8 g/dL — ABNORMAL HIGH (ref 3.7–4.7)
Alkaline Phosphatase: 90 IU/L (ref 39–117)
BUN/Creatinine Ratio: 17 (ref 12–28)
BUN: 12 mg/dL (ref 8–27)
Bilirubin Total: 0.5 mg/dL (ref 0.0–1.2)
CO2: 26 mmol/L (ref 20–29)
Calcium: 9.9 mg/dL (ref 8.7–10.3)
Chloride: 96 mmol/L (ref 96–106)
Creatinine, Ser: 0.71 mg/dL (ref 0.57–1.00)
GFR calc Af Amer: 98 mL/min/{1.73_m2} (ref >59)
GFR calc non Af Amer: 85 mL/min/{1.73_m2} (ref >59)
Globulin, Total: 2.4 g/dL (ref 1.5–4.5)
Glucose: 78 mg/dL (ref 65–99)
Potassium: 4.1 mmol/L (ref 3.5–5.2)
Sodium: 138 mmol/L (ref 134–144)
Total Protein: 7.2 g/dL (ref 6.0–8.5)

## 2019-01-21 LAB — LIPID PANEL
Chol/HDL Ratio: 2.8 ratio (ref 0.0–4.4)
Cholesterol, Total: 195 mg/dL (ref 100–199)
HDL: 70 mg/dL (ref >39)
LDL Chol Calc (NIH): 110 mg/dL — ABNORMAL HIGH (ref 0–99)
Triglycerides: 83 mg/dL (ref 0–149)
VLDL Cholesterol Cal: 15 mg/dL (ref 5–40)

## 2019-02-17 DIAGNOSIS — Z23 Encounter for immunization: Secondary | ICD-10-CM | POA: Diagnosis not present

## 2019-03-10 DIAGNOSIS — H2513 Age-related nuclear cataract, bilateral: Secondary | ICD-10-CM | POA: Diagnosis not present

## 2019-03-10 DIAGNOSIS — H40033 Anatomical narrow angle, bilateral: Secondary | ICD-10-CM | POA: Diagnosis not present

## 2019-04-29 ENCOUNTER — Other Ambulatory Visit: Payer: Self-pay | Admitting: Family Medicine

## 2019-06-25 ENCOUNTER — Encounter: Payer: Self-pay | Admitting: Nurse Practitioner

## 2019-06-25 ENCOUNTER — Ambulatory Visit (INDEPENDENT_AMBULATORY_CARE_PROVIDER_SITE_OTHER): Payer: Medicare Other | Admitting: Nurse Practitioner

## 2019-06-25 DIAGNOSIS — J069 Acute upper respiratory infection, unspecified: Secondary | ICD-10-CM | POA: Diagnosis not present

## 2019-06-25 DIAGNOSIS — J029 Acute pharyngitis, unspecified: Secondary | ICD-10-CM

## 2019-06-25 NOTE — Progress Notes (Signed)
Virtual Visit via telephone Note Due to COVID-19 pandemic this visit was conducted virtually. This visit type was conducted due to national recommendations for restrictions regarding the COVID-19 Pandemic (e.g. social distancing, sheltering in place) in an effort to limit this patient's exposure and mitigate transmission in our community. All issues noted in this document were discussed and addressed.  A physical exam was not performed with this format.  I connected with Teresa Quinn on 06/25/19 at 9:05 by telephone and verified that I am speaking with the correct person using two identifiers. Teresa Quinn is currently located at home and no one is currently with her during visit. The provider, Marabelle-Margaret Hassell Done, FNP is located in their office at time of visit.  I discussed the limitations, risks, security and privacy concerns of performing an evaluation and management service by telephone and the availability of in person appointments. I also discussed with the patient that there may be a patient responsible charge related to this service. The patient expressed understanding and agreed to proceed.   History and Present Illness:   Chief Complaint: Sore Throat   HPI Went to bed last night and had a sore throat and was still sore when she got up this morning. Throat is red and has white spot on right side. She was able to eta this morning and drink coffee with no problem.   Review of Systems  Constitutional: Negative for chills, diaphoresis, fever and weight loss.  HENT: Positive for sore throat.   Eyes: Negative for blurred vision, double vision and pain.  Respiratory: Negative for shortness of breath.   Cardiovascular: Negative for chest pain, palpitations, orthopnea and leg swelling.  Gastrointestinal: Negative for abdominal pain.  Skin: Negative for rash.  Neurological: Negative for dizziness, sensory change, loss of consciousness, weakness and headaches.    Endo/Heme/Allergies: Negative for polydipsia. Does not bruise/bleed easily.  Psychiatric/Behavioral: Negative for memory loss. The patient does not have insomnia.   All other systems reviewed and are negative.    Observations/Objective: Alert and oriented- answers all questions appropriately No distress    Assessment and Plan: Teresa Quinn in today with chief complaint of Sore Throat   1. Viral pharyngitis Force fluids Motrin or tylenol OTC OTC decongestant Throat lozenges if help New toothbrush in 3 days     The above assessment and management plan was discussed with the patient. The patient verbalized understanding of and has agreed to the management plan. Patient is aware to call the clinic if symptoms persist or worsen. Patient is aware when to return to the clinic for a follow-up visit. Patient educated on when it is appropriate to go to the emergency department.   Natilie-Margaret Hassell Done, FNP    Follow Up Instructions: prn    I discussed the assessment and treatment plan with the patient. The patient was provided an opportunity to ask questions and all were answered. The patient agreed with the plan and demonstrated an understanding of the instructions.   The patient was advised to call back or seek an in-person evaluation if the symptoms worsen or if the condition fails to improve as anticipated.  The above assessment and management plan was discussed with the patient. The patient verbalized understanding of and has agreed to the management plan. Patient is aware to call the clinic if symptoms persist or worsen. Patient is aware when to return to the clinic for a follow-up visit. Patient educated on when it is appropriate to go to the emergency  department.   Time call ended:  9:15 I provided 10 minutes of non-face-to-face time during this encounter.    Hellen-Margaret Hassell Done, FNP

## 2019-06-29 ENCOUNTER — Telehealth: Payer: Self-pay | Admitting: Family Medicine

## 2019-06-29 ENCOUNTER — Ambulatory Visit (INDEPENDENT_AMBULATORY_CARE_PROVIDER_SITE_OTHER): Payer: Medicare Other | Admitting: Family

## 2019-06-29 ENCOUNTER — Encounter: Payer: Self-pay | Admitting: Family

## 2019-06-29 DIAGNOSIS — J029 Acute pharyngitis, unspecified: Secondary | ICD-10-CM

## 2019-06-29 DIAGNOSIS — J209 Acute bronchitis, unspecified: Secondary | ICD-10-CM

## 2019-06-29 MED ORDER — PREDNISONE 10 MG (21) PO TBPK: ORAL_TABLET | ORAL | 0 refills | Status: DC

## 2019-06-29 NOTE — Progress Notes (Signed)
Virtual Visit via telephone Note Due to COVID-19 pandemic this visit was conducted virtually. This visit type was conducted due to national recommendations for restrictions regarding the COVID-19 Pandemic (e.g. social distancing, sheltering in place) in an effort to limit this patient's exposure and mitigate transmission in our community. All issues noted in this document were discussed and addressed.  A physical exam was not performed with this format.  I connected with Teresa Quinn on 06/29/19 at 3:24 pm by telephone and verified that I am speaking with the correct person using two identifiers. Teresa Quinn is currently located at home and no one is currently with her during visit. The provider, Evelina Dun, FNP is located in their office at time of visit.  I discussed the limitations, risks, security and privacy concerns of performing an evaluation and management service by telephone and the availability of in person appointments. I also discussed with the patient that there may be a patient responsible charge related to this service. The patient expressed understanding and agreed to proceed.   History and Present Illness:  Sore Throat  This is a new problem. The current episode started in the past 7 days. The problem has been waxing and waning. There has been no fever. The pain is at a severity of 1/10. The pain is mild. Associated symptoms include coughing. Pertinent negatives include no congestion, ear discharge, ear pain, headaches, hoarse voice, neck pain, shortness of breath, swollen glands or trouble swallowing. She has tried NSAIDs, acetaminophen and gargles for the symptoms. The treatment provided mild relief.  Cough This is a new problem. The current episode started in the past 7 days. The problem has been waxing and waning. The problem occurs every few minutes. The cough is non-productive. Associated symptoms include postnasal drip. Pertinent negatives include no ear pain,  headaches or shortness of breath. The symptoms are aggravated by lying down. She has tried OTC cough suppressant for the symptoms. The treatment provided mild relief.      Review of Systems  HENT: Positive for postnasal drip. Negative for congestion, ear discharge, ear pain, hoarse voice and trouble swallowing.   Respiratory: Positive for cough. Negative for shortness of breath.   Musculoskeletal: Negative for neck pain.  Neurological: Negative for headaches.  All other systems reviewed and are negative.    Observations/Objective: No SOB or distress noted  Assessment and Plan: 1. Acute bronchitis, unspecified organism Rest Force fluids Will send in rx of prednisone, pt will wait and if symptoms worsen over next 2 days she will start prednisone. If continues to improve she will not take.  - Take meds as prescribed - Use a cool mist humidifier  -Use saline nose sprays frequently -Force fluids -For any cough or congestion  Use plain Mucinex- regular strength or max strength is fine -For fever or aces or pains- take tylenol or ibuprofen. -Throat lozenges if help -Call if symptoms worsen or do not improve - predniSONE (STERAPRED UNI-PAK 21 TAB) 10 MG (21) TBPK tablet; Use as directed  Dispense: 21 tablet; Refill: 0  2. Acute pharyngitis, unspecified etiology - predniSONE (STERAPRED UNI-PAK 21 TAB) 10 MG (21) TBPK tablet; Use as directed  Dispense: 21 tablet; Refill: 0    I discussed the assessment and treatment plan with the patient. The patient was provided an opportunity to ask questions and all were answered. The patient agreed with the plan and demonstrated an understanding of the instructions.   The patient was advised to call back or  seek an in-person evaluation if the symptoms worsen or if the condition fails to improve as anticipated.  The above assessment and management plan was discussed with the patient. The patient verbalized understanding of and has agreed to the  management plan. Patient is aware to call the clinic if symptoms persist or worsen. Patient is aware when to return to the clinic for a follow-up visit. Patient educated on when it is appropriate to go to the emergency department.   Time call ended:  3:34 pm  I provided 10 minutes of non-face-to-face time during this encounter.    Evelina Dun, FNP

## 2019-07-15 ENCOUNTER — Other Ambulatory Visit: Payer: Self-pay | Admitting: Family Medicine

## 2019-07-20 ENCOUNTER — Other Ambulatory Visit: Payer: Self-pay | Admitting: Family Medicine

## 2019-07-21 DIAGNOSIS — M9904 Segmental and somatic dysfunction of sacral region: Secondary | ICD-10-CM | POA: Diagnosis not present

## 2019-07-21 DIAGNOSIS — M5137 Other intervertebral disc degeneration, lumbosacral region: Secondary | ICD-10-CM | POA: Diagnosis not present

## 2019-07-21 DIAGNOSIS — M9903 Segmental and somatic dysfunction of lumbar region: Secondary | ICD-10-CM | POA: Diagnosis not present

## 2019-07-21 DIAGNOSIS — M9905 Segmental and somatic dysfunction of pelvic region: Secondary | ICD-10-CM | POA: Diagnosis not present

## 2019-08-04 DIAGNOSIS — M9904 Segmental and somatic dysfunction of sacral region: Secondary | ICD-10-CM | POA: Diagnosis not present

## 2019-08-04 DIAGNOSIS — M9905 Segmental and somatic dysfunction of pelvic region: Secondary | ICD-10-CM | POA: Diagnosis not present

## 2019-08-04 DIAGNOSIS — M9903 Segmental and somatic dysfunction of lumbar region: Secondary | ICD-10-CM | POA: Diagnosis not present

## 2019-08-04 DIAGNOSIS — M5137 Other intervertebral disc degeneration, lumbosacral region: Secondary | ICD-10-CM | POA: Diagnosis not present

## 2019-08-09 ENCOUNTER — Telehealth: Payer: Self-pay | Admitting: Family Medicine

## 2019-08-20 ENCOUNTER — Ambulatory Visit: Payer: Medicare Other | Attending: Internal Medicine

## 2019-08-20 DIAGNOSIS — Z23 Encounter for immunization: Secondary | ICD-10-CM

## 2019-08-20 NOTE — Progress Notes (Signed)
   Covid-19 Vaccination Clinic  Name:  Teresa Quinn    MRN: [id number] DOB: 06/23/1945  08/20/2019  [name] was observed post Covid-19 immunization for 15 minutes without incident. She was provided with Vaccine Information Sheet and instruction to access the V-Safe system.   Ms. Chew was instructed to call 911 with any severe reactions post vaccine: Marland Kitchen Difficulty breathing  . Swelling of face and throat  . A fast heartbeat  . A bad rash all over body  . Dizziness and weakness   Immunizations Administered    Name Date Dose VIS Date Route   Moderna COVID-19 Vaccine 08/20/2019 11:55 AM 0.5 mL 04/13/2019 Intramuscular   Manufacturer: Moderna   Lot: [id number]   [id number]

## 2019-09-01 ENCOUNTER — Encounter: Payer: Self-pay | Admitting: *Deleted

## 2019-09-01 ENCOUNTER — Other Ambulatory Visit: Payer: Self-pay | Admitting: Family Medicine

## 2019-09-01 NOTE — Telephone Encounter (Signed)
[place]. NTBS 30 days given 07/21/19

## 2019-09-02 ENCOUNTER — Other Ambulatory Visit: Payer: Self-pay

## 2019-09-02 ENCOUNTER — Encounter: Payer: Self-pay | Admitting: Family Medicine

## 2019-09-02 ENCOUNTER — Ambulatory Visit (INDEPENDENT_AMBULATORY_CARE_PROVIDER_SITE_OTHER): Payer: Medicare Other | Admitting: Family Medicine

## 2019-09-02 VITALS — BP 120/80 | HR 92 | Temp 97.4°F | Ht 65.0 in | Wt 170.5 lb

## 2019-09-02 DIAGNOSIS — E782 Mixed hyperlipidemia: Secondary | ICD-10-CM

## 2019-09-02 DIAGNOSIS — I10 Essential (primary) hypertension: Secondary | ICD-10-CM

## 2019-09-02 MED ORDER — ATORVASTATIN CALCIUM 40 MG PO TABS: 40.0000 mg | ORAL_TABLET | Freq: Every day | ORAL | 3 refills | Status: DC

## 2019-09-02 MED ORDER — METOPROLOL SUCCINATE ER 25 MG PO TB24: 25.0000 mg | ORAL_TABLET | Freq: Every day | ORAL | 3 refills | Status: DC

## 2019-09-02 MED ORDER — HYDROCHLOROTHIAZIDE 25 MG PO TABS: 25.0000 mg | ORAL_TABLET | Freq: Every day | ORAL | 3 refills | Status: DC

## 2019-09-02 NOTE — Progress Notes (Signed)
BP 120/80   Pulse 92   Temp (!) 97.4 F (36.3 C) (Temporal)   Ht _0  (1.651 m)   Wt 170 lb 8 oz (77.3 kg)   BMI 28.37 kg/m    Subjective:   Patient ID: Teresa Quinn, female    DOB: 02-Sep-1945, 74 y.o.   MRN: [id number]  HPI: Teresa Quinn is a 74 y.o. female presenting on 09/02/2019 for Medical Management of Chronic Issues   HPI Hypertension Patient is currently on metoprolol and hydrochlorothiazide, and their blood pressure today is 120/80. Patient denies any lightheadedness or dizziness. Patient denies headaches, blurred vision, chest pains, shortness of breath, or weakness. Denies any side effects from medication and is content with current medication.   Hyperlipidemia Patient is coming in for recheck of his hyperlipidemia. The patient is currently taking fish oil and Lipitor. They deny any issues with myalgias or history of liver damage from it. They deny any focal numbness or weakness or chest pain.   Relevant past medical, surgical, family and social history reviewed and updated as indicated. Interim medical history since our last visit reviewed. Allergies and medications reviewed and updated.  Review of Systems  Constitutional: Negative for chills and fever.  HENT: Negative for congestion, ear discharge and ear pain.   Eyes: Negative for redness and visual disturbance.  Respiratory: Negative for chest tightness and shortness of breath.   Cardiovascular: Negative for chest pain and leg swelling.  Genitourinary: Negative for difficulty urinating and dysuria.  Musculoskeletal: Negative for back pain and gait problem.  Skin: Negative for rash.  Neurological: Negative for light-headedness and headaches.  Psychiatric/Behavioral: Negative for agitation and behavioral problems.  All other systems reviewed and are negative.   Per HPI unless specifically indicated above   Allergies as of 09/02/2019      Reactions   Sulfur Rash      Medication List       Accurate as of September 02, 2019  1:16 PM. If you have any questions, ask your nurse or doctor.        STOP taking these medications   predniSONE 10 MG (21) Tbpk tablet Commonly known as: STERAPRED UNI-PAK 21 TAB Stopped by: Fransisca Kaufmann Shadrach Bartunek, MD     TAKE these medications   aspirin EC 81 MG tablet Take 81 mg by mouth daily.   atorvastatin 40 MG tablet Commonly known as: LIPITOR Take 1 tablet (40 mg total) by mouth daily.   Fish Oil 1000 MG Caps Take by mouth.   hydrochlorothiazide 25 MG tablet Commonly known as: HYDRODIURIL TAKE 1 TABLET BY MOUTH EVERY DAY   Lysine 500 MG Tabs Take by mouth.   metoprolol succinate 25 MG 24 hr tablet Commonly known as: TOPROL-XL TAKE 1 TABLET (25 MG TOTAL) BY MOUTH DAILY. (NEEDS TO BE SEEN BEFORE NEXT REFILL)   multivitamin tablet Take 1 tablet by mouth daily.   VITAMIN B 12 PO Take by mouth.   VITAMIN B-6 PO Take by mouth.        Objective:   BP 120/80   Pulse 92   Temp (!) 97.4 F (36.3 C) (Temporal)   Ht _1  (1.651 m)   Wt 170 lb 8 oz (77.3 kg)   BMI 28.37 kg/m   Wt Readings from Last 3 Encounters:  09/02/19 170 lb 8 oz (77.3 kg)  01/20/19 164 lb 9.6 oz (74.7 kg)  07/20/18 159 lb (72.1 kg)    Physical Exam Vitals and nursing note reviewed.  Constitutional:      General: She is not in acute distress.    Appearance: She is well-developed. She is not diaphoretic.  Eyes:     Conjunctiva/sclera: Conjunctivae normal.  Cardiovascular:     Rate and Rhythm: Normal rate and regular rhythm.     Heart sounds: Normal heart sounds. No murmur.  Pulmonary:     Effort: Pulmonary effort is normal. No respiratory distress.     Breath sounds: Normal breath sounds. No wheezing.  Musculoskeletal:        General: No tenderness. Normal range of motion.  Skin:    General: Skin is warm and dry.     Findings: No rash.  Neurological:     Mental Status: She is alert and oriented to person, place, and time.     Coordination:  Coordination normal.  Psychiatric:        Behavior: Behavior normal.       Assessment & Plan:   Problem List Items Addressed This Visit      Cardiovascular and Mediastinum   Essential hypertension   Relevant Medications   hydrochlorothiazide (HYDRODIURIL) 25 MG tablet   atorvastatin (LIPITOR) 40 MG tablet   metoprolol succinate (TOPROL-XL) 25 MG 24 hr tablet   Other Relevant Orders   CBC with Differential/Platelet   CMP14+EGFR     Other   Hyperlipidemia - Primary   Relevant Medications   hydrochlorothiazide (HYDRODIURIL) 25 MG tablet   atorvastatin (LIPITOR) 40 MG tablet   metoprolol succinate (TOPROL-XL) 25 MG 24 hr tablet   Other Relevant Orders   CBC with Differential/Platelet   Lipid panel      Continue current medication, no change, weight is up a little bit she will refocus on getting her weight down but aside from that she is looking pretty good and we will monitor blood work. Follow up plan: Return in about 6 months (around 03/03/2020), or if symptoms worsen or fail to improve, for Hypertension and cholesterol recheck.  Counseling provided for all of the vaccine components No orders of the defined types were placed in this encounter.   Caryl Pina, MD [facility] 09/02/2019, 1:16 PM

## 2019-09-03 ENCOUNTER — Ambulatory Visit (INDEPENDENT_AMBULATORY_CARE_PROVIDER_SITE_OTHER): Payer: Medicare Other

## 2019-09-03 VITALS — BP 120/80

## 2019-09-03 DIAGNOSIS — Z0000 Encounter for general adult medical examination without abnormal findings: Secondary | ICD-10-CM

## 2019-09-03 LAB — LIPID PANEL
Chol/HDL Ratio: 2.9 ratio (ref 0.0–4.4)
Cholesterol, Total: 169 mg/dL (ref 100–199)
HDL: 59 mg/dL (ref >39)
LDL Chol Calc (NIH): 95 mg/dL (ref 0–99)
Triglycerides: 83 mg/dL (ref 0–149)
VLDL Cholesterol Cal: 15 mg/dL (ref 5–40)

## 2019-09-03 LAB — CBC WITH DIFFERENTIAL/PLATELET
Basophils Absolute: 0.1 10*3/uL (ref 0.0–0.2)
Basos: 1 %
EOS (ABSOLUTE): 0.1 10*3/uL (ref 0.0–0.4)
Eos: 1 %
Hematocrit: 40.3 % (ref 34.0–46.6)
Hemoglobin: 13.8 g/dL (ref 11.1–15.9)
Immature Grans (Abs): 0 10*3/uL (ref 0.0–0.1)
Immature Granulocytes: 0 %
Lymphocytes Absolute: 1.7 10*3/uL (ref 0.7–3.1)
Lymphs: 22 %
MCH: 32.4 pg (ref 26.6–33.0)
MCHC: 34.2 g/dL (ref 31.5–35.7)
MCV: 95 fL (ref 79–97)
Monocytes Absolute: 0.7 10*3/uL (ref 0.1–0.9)
Monocytes: 9 %
Neutrophils Absolute: 5.2 10*3/uL (ref 1.4–7.0)
Neutrophils: 67 %
Platelets: 338 10*3/uL (ref 150–450)
RBC: 4.26 x10E6/uL (ref 3.77–5.28)
RDW: 12.5 % (ref 11.7–15.4)
WBC: 7.8 10*3/uL (ref 3.4–10.8)

## 2019-09-03 LAB — CMP14+EGFR
ALT: 32 IU/L (ref 0–32)
AST: 33 IU/L (ref 0–40)
Albumin/Globulin Ratio: 1.9 (ref 1.2–2.2)
Albumin: 4.5 g/dL (ref 3.7–4.7)
Alkaline Phosphatase: 84 IU/L (ref 39–117)
BUN/Creatinine Ratio: 15 (ref 12–28)
BUN: 12 mg/dL (ref 8–27)
Bilirubin Total: 0.4 mg/dL (ref 0.0–1.2)
CO2: 26 mmol/L (ref 20–29)
Calcium: 9.8 mg/dL (ref 8.7–10.3)
Chloride: 98 mmol/L (ref 96–106)
Creatinine, Ser: 0.78 mg/dL (ref 0.57–1.00)
GFR calc Af Amer: 87 mL/min/{1.73_m2} (ref >59)
GFR calc non Af Amer: 76 mL/min/{1.73_m2} (ref >59)
Globulin, Total: 2.4 g/dL (ref 1.5–4.5)
Glucose: 82 mg/dL (ref 65–99)
Potassium: 3.8 mmol/L (ref 3.5–5.2)
Sodium: 138 mmol/L (ref 134–144)
Total Protein: 6.9 g/dL (ref 6.0–8.5)

## 2019-09-03 NOTE — Patient Instructions (Signed)
  [place] Maintenance Summary and Written Plan of Care  [name] ,  Thank you for allowing me to perform your Medicare Annual Wellness Visit and for your ongoing commitment to your health.   Health Maintenance & Immunization History Health Maintenance  Topic Date Due  . Hepatitis C Screening  Never done  . TETANUS/TDAP  09/01/2020 (Originally 04/06/1965)  . INFLUENZA VACCINE  12/12/2019  . MAMMOGRAM  01/03/2021  . COLONOSCOPY  07/25/2021  . DEXA SCAN  Completed  . COVID-19 Vaccine  Completed  . PNA vac Low Risk Adult  Completed   Immunization History  Administered Date(s) Administered  . Fluad Quad(high Dose 65+) 02/17/2019  . Influenza, High Dose Seasonal PF 02/25/2017, 02/24/2018  . Influenza,inj,Quad PF,6+ Mos 02/11/2016  . Moderna SARS-COVID-2 Vaccination 07/15/2019, 08/20/2019  . Pneumococcal Conjugate-13 03/01/2014  . Pneumococcal Polysaccharide-23 01/20/2019    These are the patient goals that we discussed:  Patient states that she is going to start walking.   This is a list of Health Maintenance Items that are overdue or due now: Health Maintenance Due  Topic Date Due  . Hepatitis C Screening  Never done     Orders/Referrals Placed Today: No orders of the defined types were placed in this encounter.  (Contact our referral department at [phone] if you have not spoken with someone about your referral appointment within the next 5 days)    Follow-up Plan  Scheduled with Dr. Warrick Parisian 03/03/2020

## 2019-09-03 NOTE — Progress Notes (Signed)
MEDICARE ANNUAL WELLNESS VISIT  09/03/2019  Telephone Visit Disclaimer This Medicare AWV was conducted by telephone due to national recommendations for restrictions regarding the COVID-19 Pandemic (e.g. social distancing).  I verified, using two identifiers, that I am speaking with Teresa Quinn or their authorized healthcare agent. I discussed the limitations, risks, security, and privacy concerns of performing an evaluation and management service by telephone and the potential availability of an in-person appointment in the future. The patient expressed understanding and agreed to proceed.   Subjective:  Teresa Quinn is a 74 y.o. female patient of Dettinger, Fransisca Kaufmann, MD who had a Medicare Annual Wellness Visit today via telephone. 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 she has one child. A son that lives in [place] she reports that she is socially active and does interact with friends/family regularly. she is minimally physically active and enjoys.  Patient Care Team: Dettinger, Fransisca Kaufmann, MD as PCP - General (Family Medicine)  Advanced Directives 09/03/2019  Does Patient Have a Medical Advance Directive? Yes  Type of Advance Directive Living will  Does patient want to make changes to medical advance directive? No - Patient declined  Would patient like information on creating a medical advance directive? No - Patient declined    Hospital Utilization Over the Past 12 Months: # of hospitalizations or ER visits: 0 # of surgeries: 0  Review of Systems    Patient reports that her overall health is unchanged compared to last year.  Patient Reported Readings (BP 120/80, blood glucose 70  Pain Assessment Pain : No/denies pain     Current Medications & Allergies (verified) Allergies as of 09/03/2019      Reactions   Sulfur Rash      Medication List       Accurate as of September 03, 2019  8:22 AM. If you have any questions, ask your nurse  or doctor.        aspirin EC 81 MG tablet Take 81 mg by mouth daily.   atorvastatin 40 MG tablet Commonly known as: LIPITOR Take 1 tablet (40 mg total) by mouth daily.   Fish Oil 1000 MG Caps Take by mouth.   hydrochlorothiazide 25 MG tablet Commonly known as: HYDRODIURIL Take 1 tablet (25 mg total) by mouth daily.   Lysine 500 MG Tabs Take by mouth.   metoprolol succinate 25 MG 24 hr tablet Commonly known as: TOPROL-XL Take 1 tablet (25 mg total) by mouth daily. (Needs to be seen before next refill)   multivitamin tablet Take 1 tablet by mouth daily.   VITAMIN B 12 PO Take by mouth.   VITAMIN B-6 PO Take by mouth.       History (reviewed): Past Medical History:  Diagnosis Date  . Hyperlipidemia   . Hypertension    Past Surgical History:  Procedure Laterality Date  . ABDOMINAL HYSTERECTOMY  1991   Family History  Problem Relation Age of Onset  . Hypertension Mother   . Hyperlipidemia Father   . Hypertension Father    Social History   Socioeconomic History  . Marital status: Married    Spouse name: Not on file  . Number of children: Not on file  . Years of education: Not on file  . Highest education level: Not on file  Occupational History  . Not on file  Tobacco Use  . Smoking status: Never Smoker  . Smokeless tobacco: Never Used  Substance and  Sexual Activity  . Alcohol use: No  . Drug use: No  . Sexual activity: Never  Other Topics Concern  . Not on file  Social History Narrative  . Not on file   Social Determinants of Health   Financial Resource Strain:   . Difficulty of Paying Living Expenses:   Food Insecurity:   . Worried About Charity fundraiser in the Last Year:   . Arboriculturist in the Last Year:   Transportation Needs:   . Film/video editor (Medical):   Marland Kitchen Lack of Transportation (Non-Medical):   Physical Activity:   . Days of Exercise per Week:   . Minutes of Exercise per Session:   Stress:   . Feeling of Stress  :   Social Connections:   . Frequency of Communication with Friends and Family:   . Frequency of Social Gatherings with Friends and Family:   . Attends Religious Services:   . Active Member of Clubs or Organizations:   . Attends Archivist Meetings:   Marland Kitchen Marital Status:     Activities of Daily Living In your present state of health, do you have any difficulty performing the following activities: 09/03/2019  Hearing? N  Vision? N  Difficulty concentrating or making decisions? N  Walking or climbing stairs? N  Dressing or bathing? N  Doing errands, shopping? N  Preparing Food and eating ? N  Using the Toilet? N  In the past six months, have you accidently leaked urine? N  Do you have problems with loss of bowel control? N  Managing your Medications? N  Managing your Finances? N  Housekeeping or managing your Housekeeping? N  Some recent data might be hidden    Patient Education/ Literacy How often do you need to have someone help you when you read instructions, pamphlets, or other written materials from your doctor or pharmacy?: 1 - Never What is the last grade level you completed in school?: 12th  Exercise Current Exercise Habits: The patient does not participate in regular exercise at present(States that she will start walking. Noticed that she has gained six pounds.), Exercise limited by: None identified  Diet Patient reports consuming 3 meals a day and 0 snack(s) a day Patient reports that her primary diet is: Regular Patient reports that she does have regular access to food.   Depression Screen PHQ 2/9 Scores 09/03/2019 09/02/2019 01/20/2019 07/20/2018 07/02/2018 01/19/2018 07/18/2017  PHQ - 2 Score 0 0 0 0 0 0 0     Fall Risk Fall Risk  09/03/2019 09/02/2019 01/20/2019 07/20/2018 07/02/2018  Falls in the past year? 0 0 0 0 0     Objective:  Teresa Quinn seemed alert and oriented and she participated appropriately during our telephone visit.  Blood Pressure Weight  BMI  BP Readings from Last 3 Encounters:  09/03/19 120/80  09/02/19 120/80  01/20/19 (!) 144/85   Wt Readings from Last 3 Encounters:  09/02/19 170 lb 8 oz (77.3 kg)  01/20/19 164 lb 9.6 oz (74.7 kg)  07/20/18 159 lb (72.1 kg)   BMI Readings from Last 1 Encounters:  09/02/19 28.37 kg/m    *Unable to obtain current vital signs, weight, and BMI due to telephone visit type  Hearing/Vision  . Teresa Quinn did not seem to have difficulty with hearing/understanding during the telephone conversation . Reports that she has had a formal eye exam by an eye care professional within the past year . Reports that she has not  had a formal hearing evaluation within the past year *Unable to fully assess hearing and vision during telephone visit type  Cognitive Function: 6CIT Screen 09/03/2019  What Year? 0 points  What month? 0 points  What time? 0 points  Count back from 20 0 points  Months in reverse 0 points  Repeat phrase 0 points  Total Score 0   (Normal:0-7, Significant for Dysfunction: >8)  Normal Cognitive Function Screening: Yes   Immunization & Health Maintenance Record Immunization History  Administered Date(s) Administered  . Fluad Quad(high Dose 65+) 02/17/2019  . Influenza, High Dose Seasonal PF 02/25/2017, 02/24/2018  . Influenza,inj,Quad PF,6+ Mos 02/11/2016  . Moderna SARS-COVID-2 Vaccination 07/15/2019, 08/20/2019  . Pneumococcal Conjugate-13 03/01/2014  . Pneumococcal Polysaccharide-23 01/20/2019    Health Maintenance  Topic Date Due  . Hepatitis C Screening  Never done  . TETANUS/TDAP  09/01/2020 (Originally 04/06/1965)  . INFLUENZA VACCINE  12/12/2019  . MAMMOGRAM  01/03/2021  . COLONOSCOPY  07/25/2021  . DEXA SCAN  Completed  . COVID-19 Vaccine  Completed  . PNA vac Low Risk Adult  Completed       Assessment  This is a routine wellness examination for Teresa Quinn.  Health Maintenance: Due or Overdue Health Maintenance Due  Topic Date Due  .  Hepatitis C Screening  Never done    Teresa Quinn does not need a referral for Community Assistance: Care Management:   no Social Work:    no Prescription Assistance:  no Nutrition/Diabetes Education:  no   Plan:  Personalized Goals  Exercise 150 minutes per week  Personalized Health Maintenance & Screening Recommendations    Lung Cancer Screening Recommended: no (Low Dose CT Chest recommended if Age 61-80 years, 30 pack-year currently smoking OR have quit w/in past 15 years) Hepatitis C Screening recommended: no HIV Screening recommended: no  Advanced Directives: Written information was not prepared per patient's request.  Referrals & Orders No orders of the defined types were placed in this encounter.   Follow-up Plan . Follow-up with Dettinger, Fransisca Kaufmann, MD as planned . Schedule 03/03/2020   I have personally reviewed and noted the following in the patient's chart:   . Medical and social history . Use of alcohol, tobacco or illicit drugs  . Current medications and supplements . Functional ability and status . Nutritional status . Physical activity . Advanced directives . List of other physicians . Hospitalizations, surgeries, and ER visits in previous 12 months . Vitals . Screenings to include cognitive, depression, and falls . Referrals and appointments  In addition, I have reviewed and discussed with Teresa Quinn certain preventive protocols, quality metrics, and best practice recommendations. A written personalized care plan for preventive services as well as general preventive health recommendations is available and can be mailed to the patient at her request.      Maud Deed [facility]  [id number]

## 2019-09-08 DIAGNOSIS — M9904 Segmental and somatic dysfunction of sacral region: Secondary | ICD-10-CM | POA: Diagnosis not present

## 2019-09-08 DIAGNOSIS — M9905 Segmental and somatic dysfunction of pelvic region: Secondary | ICD-10-CM | POA: Diagnosis not present

## 2019-09-08 DIAGNOSIS — M5137 Other intervertebral disc degeneration, lumbosacral region: Secondary | ICD-10-CM | POA: Diagnosis not present

## 2019-09-08 DIAGNOSIS — M9903 Segmental and somatic dysfunction of lumbar region: Secondary | ICD-10-CM | POA: Diagnosis not present

## 2019-10-08 ENCOUNTER — Telehealth: Payer: Self-pay | Admitting: Family Medicine

## 2019-10-08 MED ORDER — LINACLOTIDE 72 MCG PO CAPS: 72.0000 ug | ORAL_CAPSULE | Freq: Every day | ORAL | 5 refills | Status: DC

## 2019-10-08 NOTE — Telephone Encounter (Signed)
She thinks it was the lowest strength and so that will be fine.

## 2019-10-08 NOTE — Telephone Encounter (Signed)
Details left on voice mail.

## 2019-10-08 NOTE — Telephone Encounter (Signed)
I do not recall what dose of samples we gave to her, was at the 72 or the 144, let me know so we can send it in for her. Caryl Pina, MD [facility] 10/08/2019, 1:47 PM

## 2019-10-08 NOTE — Telephone Encounter (Signed)
Have placed an order for Linzess, please warn the patient that she will likely need to go for prescription assistance for this, if she needs any help then please put her in touch with Almyra Free or the county health department who could help her with the prescription assistance.

## 2019-10-08 NOTE — Telephone Encounter (Signed)
  Prescription Request  10/08/2019  What is the name of the medication or equipment? linzess  Have you contacted your pharmacy to request a refill? (if applicable) no new rx samples--pt says that Dettinger samples and now wants an rx  Which pharmacy would you like this sent to? [facility]   Patient notified that their request is being sent to the clinical staff for review and that they should receive a response within 2 business days.

## 2019-11-06 DIAGNOSIS — W57XXXA Bitten or stung by nonvenomous insect and other nonvenomous arthropods, initial encounter: Secondary | ICD-10-CM | POA: Diagnosis not present

## 2019-11-06 DIAGNOSIS — L03113 Cellulitis of right upper limb: Secondary | ICD-10-CM | POA: Diagnosis not present

## 2019-11-24 ENCOUNTER — Other Ambulatory Visit: Payer: Self-pay | Admitting: Family Medicine

## 2019-11-24 DIAGNOSIS — Z1231 Encounter for screening mammogram for malignant neoplasm of breast: Secondary | ICD-10-CM

## 2019-12-08 ENCOUNTER — Other Ambulatory Visit: Payer: Self-pay

## 2019-12-08 ENCOUNTER — Ambulatory Visit
Admission: RE | Admit: 2019-12-08 | Discharge: 2019-12-08 | Disposition: A | Discharge status: Home / Self Care | Payer: Medicare Other | Source: Ambulatory Visit | Attending: Family Medicine | Admitting: Family Medicine

## 2019-12-08 DIAGNOSIS — Z1231 Encounter for screening mammogram for malignant neoplasm of breast: Secondary | ICD-10-CM

## 2019-12-13 DIAGNOSIS — R319 Hematuria, unspecified: Secondary | ICD-10-CM | POA: Diagnosis not present

## 2019-12-13 DIAGNOSIS — R300 Dysuria: Secondary | ICD-10-CM | POA: Diagnosis not present

## 2019-12-13 DIAGNOSIS — N390 Urinary tract infection, site not specified: Secondary | ICD-10-CM | POA: Diagnosis not present

## 2020-03-03 ENCOUNTER — Ambulatory Visit (INDEPENDENT_AMBULATORY_CARE_PROVIDER_SITE_OTHER): Payer: Medicare Other | Admitting: Family Medicine

## 2020-03-03 ENCOUNTER — Other Ambulatory Visit: Payer: Self-pay

## 2020-03-03 ENCOUNTER — Encounter: Payer: Self-pay | Admitting: Family Medicine

## 2020-03-03 VITALS — BP 121/82 | HR 80 | Temp 97.0°F | Ht 65.0 in | Wt 169.0 lb

## 2020-03-03 DIAGNOSIS — Z1159 Encounter for screening for other viral diseases: Secondary | ICD-10-CM | POA: Diagnosis not present

## 2020-03-03 DIAGNOSIS — I10 Essential (primary) hypertension: Secondary | ICD-10-CM | POA: Diagnosis not present

## 2020-03-03 DIAGNOSIS — E782 Mixed hyperlipidemia: Secondary | ICD-10-CM

## 2020-03-03 DIAGNOSIS — Z23 Encounter for immunization: Secondary | ICD-10-CM

## 2020-03-03 NOTE — Progress Notes (Signed)
BP 121/82   Pulse 80   Temp (!) 97 F (36.1 C)   Ht _0  (1.651 m)   Wt 169 lb (76.7 kg)   SpO2 98%   BMI 28.12 kg/m    Subjective:   Patient ID: Teresa Quinn, female    DOB: [birth year], 74 y.o.   MRN: [id number]  HPI: Meaghen Vecchiarelli is a 74 y.o. female presenting on 03/03/2020 for Medical Management of Chronic Issues and Hyperlipidemia   HPI Hyperlipidemia Patient is coming in for recheck of his hyperlipidemia. The patient is currently taking fish oils and atorvastatin. They deny any issues with myalgias or history of liver damage from it. They deny any focal numbness or weakness or chest pain.   Hypertension Patient is currently on hydrochlorothiazide and metoprolol, and their blood pressure today is 121/82. Patient denies any lightheadedness or dizziness. Patient denies headaches, blurred vision, chest pains, shortness of breath, or weakness. Denies any side effects from medication and is content with current medication.   Relevant past medical, surgical, family and social history reviewed and updated as indicated. Interim medical history since our last visit reviewed. Allergies and medications reviewed and updated.  Review of Systems  Constitutional: Negative for chills and fever.  Eyes: Negative for redness and visual disturbance.  Respiratory: Negative for chest tightness and shortness of breath.   Cardiovascular: Negative for chest pain and leg swelling.  Genitourinary: Negative for difficulty urinating and dysuria.  Musculoskeletal: Negative for back pain and gait problem.  Skin: Negative for rash.  Neurological: Negative for light-headedness and headaches.  Psychiatric/Behavioral: Negative for agitation and behavioral problems.  All other systems reviewed and are negative.   Per HPI unless specifically indicated above   Allergies as of 03/03/2020      Reactions   Sulfur Rash      Medication List       Accurate as of March 03, 2020  1:12 PM.  If you have any questions, ask your nurse or doctor.        aspirin EC 81 MG tablet Take 81 mg by mouth daily.   atorvastatin 40 MG tablet Commonly known as: LIPITOR Take 1 tablet (40 mg total) by mouth daily.   Fish Oil 1000 MG Caps Take by mouth.   hydrochlorothiazide 25 MG tablet Commonly known as: HYDRODIURIL Take 1 tablet (25 mg total) by mouth daily.   linaclotide 72 MCG capsule Commonly known as: Linzess Take 1 capsule (72 mcg total) by mouth daily before breakfast.   loratadine 10 MG tablet Commonly known as: CLARITIN Take 10 mg by mouth daily.   Lysine 500 MG Tabs Take by mouth.   metoprolol succinate 25 MG 24 hr tablet Commonly known as: TOPROL-XL Take 1 tablet (25 mg total) by mouth daily. (Needs to be seen before next refill)   multivitamin tablet Take 1 tablet by mouth daily.   VITAMIN B 12 PO Take by mouth.   VITAMIN B-6 PO Take by mouth.        Objective:   BP 121/82   Pulse 80   Temp (!) 97 F (36.1 C)   Ht _1  (1.651 m)   Wt 169 lb (76.7 kg)   SpO2 98%   BMI 28.12 kg/m   Wt Readings from Last 3 Encounters:  03/03/20 169 lb (76.7 kg)  09/02/19 170 lb 8 oz (77.3 kg)  01/20/19 164 lb 9.6 oz (74.7 kg)    Physical Exam Vitals and nursing note reviewed.  Constitutional:      General: She is not in acute distress.    Appearance: She is well-developed. She is not diaphoretic.  Eyes:     Conjunctiva/sclera: Conjunctivae normal.  Cardiovascular:     Rate and Rhythm: Normal rate and regular rhythm.     Heart sounds: Normal heart sounds. No murmur heard.   Pulmonary:     Effort: Pulmonary effort is normal. No respiratory distress.     Breath sounds: Normal breath sounds. No wheezing.  Musculoskeletal:        General: No tenderness. Normal range of motion.  Skin:    General: Skin is warm and dry.     Findings: No rash.  Neurological:     Mental Status: She is alert and oriented to person, place, and time.     Coordination:  Coordination normal.  Psychiatric:        Behavior: Behavior normal.       Assessment & Plan:   Problem List Items Addressed This Visit      Cardiovascular and Mediastinum   Essential hypertension   Relevant Orders   CMP14+EGFR     Other   Hyperlipidemia - Primary   Relevant Orders   Lipid panel    Other Visit Diagnoses    Flu vaccine need       Relevant Orders   Flu Vaccine QUAD High Dose(Fluad) (Completed)   Encounter for hepatitis C screening test for low risk patient       Relevant Orders   Hepatitis C antibody      Will check blood work, continue current medications see back in 6 months, no changes. Follow up plan: Return in about 6 months (around 09/01/2020), or if symptoms worsen or fail to improve, for Hypertension and cholesterol follow-up.  Counseling provided for all of the vaccine components Orders Placed This Encounter  Procedures  . Flu Vaccine QUAD High Dose(Fluad)  . Hepatitis C antibody    Caryl Pina, MD [facility] 03/03/2020, 1:12 PM

## 2020-03-04 LAB — CMP14+EGFR
ALT: 31 IU/L (ref 0–32)
AST: 38 IU/L (ref 0–40)
Albumin/Globulin Ratio: 2 (ref 1.2–2.2)
Albumin: 4.9 g/dL — ABNORMAL HIGH (ref 3.7–4.7)
Alkaline Phosphatase: 94 IU/L (ref 44–121)
BUN/Creatinine Ratio: 18 (ref 12–28)
BUN: 14 mg/dL (ref 8–27)
Bilirubin Total: 0.5 mg/dL (ref 0.0–1.2)
CO2: 28 mmol/L (ref 20–29)
Calcium: 10.4 mg/dL — ABNORMAL HIGH (ref 8.7–10.3)
Chloride: 97 mmol/L (ref 96–106)
Creatinine, Ser: 0.78 mg/dL (ref 0.57–1.00)
GFR calc Af Amer: 87 mL/min/{1.73_m2} (ref >59)
GFR calc non Af Amer: 76 mL/min/{1.73_m2} (ref >59)
Globulin, Total: 2.4 g/dL (ref 1.5–4.5)
Glucose: 85 mg/dL (ref 65–99)
Potassium: 5 mmol/L (ref 3.5–5.2)
Sodium: 138 mmol/L (ref 134–144)
Total Protein: 7.3 g/dL (ref 6.0–8.5)

## 2020-03-04 LAB — LIPID PANEL
Chol/HDL Ratio: 3.1 ratio (ref 0.0–4.4)
Cholesterol, Total: 178 mg/dL (ref 100–199)
HDL: 57 mg/dL (ref >39)
LDL Chol Calc (NIH): 102 mg/dL — ABNORMAL HIGH (ref 0–99)
Triglycerides: 103 mg/dL (ref 0–149)
VLDL Cholesterol Cal: 19 mg/dL (ref 5–40)

## 2020-03-04 LAB — HEPATITIS C ANTIBODY: Hep C Virus Ab: <0.1 s/co ratio (ref 0.0–0.9)

## 2020-06-27 ENCOUNTER — Encounter: Payer: Self-pay | Admitting: *Deleted

## 2020-08-06 ENCOUNTER — Other Ambulatory Visit: Payer: Self-pay | Admitting: Family Medicine

## 2020-08-30 ENCOUNTER — Other Ambulatory Visit: Payer: Self-pay | Admitting: Family Medicine

## 2020-09-01 ENCOUNTER — Ambulatory Visit: Payer: Medicare Other | Admitting: Family Medicine

## 2020-09-06 ENCOUNTER — Encounter: Payer: Self-pay | Admitting: Family Medicine

## 2020-09-06 ENCOUNTER — Ambulatory Visit (INDEPENDENT_AMBULATORY_CARE_PROVIDER_SITE_OTHER): Payer: Medicare Other | Admitting: Family Medicine

## 2020-09-06 DIAGNOSIS — E782 Mixed hyperlipidemia: Secondary | ICD-10-CM | POA: Diagnosis not present

## 2020-09-06 DIAGNOSIS — I10 Essential (primary) hypertension: Secondary | ICD-10-CM

## 2020-09-06 DIAGNOSIS — R0989 Other specified symptoms and signs involving the circulatory and respiratory systems: Secondary | ICD-10-CM | POA: Diagnosis not present

## 2020-09-06 DIAGNOSIS — K589 Irritable bowel syndrome without diarrhea: Secondary | ICD-10-CM | POA: Diagnosis not present

## 2020-09-06 MED ORDER — HYDROCHLOROTHIAZIDE 25 MG PO TABS: 1.0000 | ORAL_TABLET | Freq: Every day | ORAL | 3 refills | Status: DC

## 2020-09-06 MED ORDER — LINACLOTIDE 72 MCG PO CAPS: 72.0000 ug | ORAL_CAPSULE | Freq: Every day | ORAL | 3 refills | Status: DC

## 2020-09-06 MED ORDER — PREDNISONE 20 MG PO TABS: ORAL_TABLET | ORAL | 0 refills | Status: DC

## 2020-09-06 MED ORDER — METOPROLOL SUCCINATE ER 25 MG PO TB24: 25.0000 mg | ORAL_TABLET | Freq: Every day | ORAL | 3 refills | Status: DC

## 2020-09-06 MED ORDER — ATORVASTATIN CALCIUM 40 MG PO TABS: 1.0000 | ORAL_TABLET | Freq: Every day | ORAL | 3 refills | Status: DC

## 2020-09-06 NOTE — Progress Notes (Signed)
Virtual Visit via telephone Note  I connected with Teresa Quinn on 09/06/20 at 1352 by telephone and verified that I am speaking with the correct person using two identifiers. Teresa Quinn is currently located at home and patient are currently with her during visit. The provider, Fransisca Kaufmann Akeisha Lagerquist, MD is located in their office at time of visit.  Call ended at 1408  I discussed the limitations, risks, security and privacy concerns of performing an evaluation and management service by telephone and the availability of in person appointments. I also discussed with the patient that there may be a patient responsible charge related to this service. The patient expressed understanding and agreed to proceed.  130/83 BS 74, temp 98.3, O2 98 History and Present Illness: Hypertension Patient is currently on metoprolol and hctz, and their blood pressure today is 130/83. Patient denies any lightheadedness or dizziness. Patient denies headaches, blurred vision, chest pains, shortness of breath, or weakness. Denies any side effects from medication and is content with current medication.   Hyperlipidemia Patient is coming in for recheck of his hyperlipidemia. The patient is currently taking atorvastatin. They deny any issues with myalgias or history of liver damage from it. They deny any focal numbness or weakness or chest pain.   Patient has a cough and chest cold and it has been going for 2 days.  She denies fevers or chills.  She takes claritin. She denies sick contacts.  She denies wheezing or shortness of breath.  She has a dry cough.  She feels like it is slightly worsening and last night had coughing that awoke.  She denies sinus congestion.  No diagnosis found.  Outpatient Encounter Medications as of 09/06/2020  Medication Sig  . aspirin EC 81 MG tablet Take 81 mg by mouth daily.  Marland Kitchen atorvastatin (LIPITOR) 40 MG tablet TAKE 1 TABLET BY MOUTH EVERY DAY  . Cyanocobalamin (VITAMIN B 12  PO) Take by mouth.  . hydrochlorothiazide (HYDRODIURIL) 25 MG tablet TAKE 1 TABLET BY MOUTH EVERY DAY  . linaclotide (LINZESS) 72 MCG capsule Take 1 capsule (72 mcg total) by mouth daily before breakfast.  . loratadine (CLARITIN) 10 MG tablet Take 10 mg by mouth daily.  Marland Kitchen Lysine 500 MG TABS Take by mouth.  . metoprolol succinate (TOPROL-XL) 25 MG 24 hr tablet TAKE 1 TABLET (25 MG TOTAL) BY MOUTH DAILY. (NEEDS TO BE SEEN BEFORE NEXT REFILL)  . Multiple Vitamin (MULTIVITAMIN) tablet Take 1 tablet by mouth daily.  . Omega-3 Fatty Acids (FISH OIL) 1000 MG CAPS Take by mouth.  . Pyridoxine HCl (VITAMIN B-6 PO) Take by mouth.   No facility-administered encounter medications on file as of 09/06/2020.    Review of Systems  Constitutional: Negative for chills and fever.  HENT: Positive for congestion and postnasal drip. Negative for ear discharge, ear pain, rhinorrhea, sinus pressure, sneezing and sore throat.   Eyes: Negative for pain, redness and visual disturbance.  Respiratory: Positive for cough. Negative for chest tightness, shortness of breath and wheezing.   Cardiovascular: Negative for chest pain and leg swelling.  Genitourinary: Negative for difficulty urinating and dysuria.  Musculoskeletal: Negative for back pain and gait problem.  Skin: Negative for rash.  Neurological: Negative for light-headedness and headaches.  Psychiatric/Behavioral: Negative for agitation and behavioral problems.  All other systems reviewed and are negative.   Observations/Objective: Patient sounds comfortable and in no acute distress  Assessment and Plan: Problem List Items Addressed This Visit  Cardiovascular and Mediastinum   Essential hypertension - Primary   Relevant Medications   atorvastatin (LIPITOR) 40 MG tablet   hydrochlorothiazide (HYDRODIURIL) 25 MG tablet   metoprolol succinate (TOPROL-XL) 25 MG 24 hr tablet     Other   Hyperlipidemia   Relevant Medications   atorvastatin  (LIPITOR) 40 MG tablet   hydrochlorothiazide (HYDRODIURIL) 25 MG tablet   metoprolol succinate (TOPROL-XL) 25 MG 24 hr tablet    Other Visit Diagnoses    Irritable bowel syndrome, unspecified type       Relevant Medications   linaclotide (LINZESS) 72 MCG capsule   Chest congestion       Relevant Medications   predniSONE (DELTASONE) 20 MG tablet      Patient sounds comfortable and in no acute distress, sounds like she probably just has little bit of a chest cold versus allergies.  We will give a short course of prednisone that should help along with the medications that she is already taking.  Blood pressure sounds good at home, continue current medication see back in 6 months. Follow up plan: Return in about 6 months (around 03/08/2021), or if symptoms worsen or fail to improve, for Hypertension and cholesterol.     I discussed the assessment and treatment plan with the patient. The patient was provided an opportunity to ask questions and all were answered. The patient agreed with the plan and demonstrated an understanding of the instructions.   The patient was advised to call back or seek an in-person evaluation if the symptoms worsen or if the condition fails to improve as anticipated.  The above assessment and management plan was discussed with the patient. The patient verbalized understanding of and has agreed to the management plan. Patient is aware to call the clinic if symptoms persist or worsen. Patient is aware when to return to the clinic for a follow-up visit. Patient educated on when it is appropriate to go to the emergency department.    I provided 16 minutes of non-face-to-face time during this encounter.    Worthy Rancher, MD

## 2020-09-07 MED ORDER — HYDROCHLOROTHIAZIDE 25 MG PO TABS: 1.0000 | ORAL_TABLET | Freq: Every day | ORAL | 3 refills | Status: DC

## 2020-09-07 MED ORDER — METOPROLOL SUCCINATE ER 25 MG PO TB24: 25.0000 mg | ORAL_TABLET | Freq: Every day | ORAL | 3 refills | Status: DC

## 2020-09-07 MED ORDER — LINACLOTIDE 72 MCG PO CAPS: 72.0000 ug | ORAL_CAPSULE | Freq: Every day | ORAL | 3 refills | Status: DC

## 2020-09-07 MED ORDER — ATORVASTATIN CALCIUM 40 MG PO TABS: 1.0000 | ORAL_TABLET | Freq: Every day | ORAL | 3 refills | Status: DC

## 2020-09-07 NOTE — Addendum Note (Signed)
Addended by: Antonietta Barcelona D on: 09/07/2020 10:42 AM   Modules accepted: Orders

## 2020-09-07 NOTE — Progress Notes (Signed)
Refills in E-Prescribing error basket, saying 'in progress' since yesterday. Resending.

## 2020-09-27 ENCOUNTER — Ambulatory Visit (INDEPENDENT_AMBULATORY_CARE_PROVIDER_SITE_OTHER): Payer: Medicare Other

## 2020-09-27 VITALS — Ht 60.0 in | Wt 160.0 lb

## 2020-09-27 DIAGNOSIS — Z1231 Encounter for screening mammogram for malignant neoplasm of breast: Secondary | ICD-10-CM

## 2020-09-27 DIAGNOSIS — Z0000 Encounter for general adult medical examination without abnormal findings: Secondary | ICD-10-CM

## 2020-09-27 NOTE — Patient Instructions (Signed)
Teresa Quinn , Thank you for taking time to come for your Medicare Wellness Visit. I appreciate your ongoing commitment to your health goals. Please review the following plan we discussed and let me know if I can assist you in the future.   Screening recommendations/referrals: Colonoscopy: Done 07/26/2011 - Repeat in 10 years Mammogram: Done 12/08/2019 - Repeat annually Bone Density: Done 07/20/2018 - Repeat every 2 years *do this at next visit Recommended yearly ophthalmology/optometry visit for glaucoma screening and checkup Recommended yearly dental visit for hygiene and checkup  Vaccinations: Influenza vaccine: Done 03/03/2020 - Repeat annually Pneumococcal vaccine: Done 03/01/2014 & 01/20/2019 Tdap vaccine: DUE (every 10 years) Shingles vaccine: DUE Shingrix discussed. Please contact your pharmacy for coverage information.    Covid-19: Done 07/15/19, 08/20/19, & 04/14/2020 - due for second booster  Advanced directives: Please bring a copy of your health care power of attorney and living will to the office to be added to your chart at your convenience.   Conditions/risks identified: Aim for 30 minutes of exercise or brisk walking each day, drink 6-8 glasses of water and eat lots of fruits and vegetables.  Next appointment: Follow up in one year for your annual wellness visit    Preventive Care 65 Years and Older, Female Preventive care refers to lifestyle choices and visits with your health care provider that can promote health and wellness. What does preventive care include?  A yearly physical exam. This is also called an annual well check.  Dental exams once or twice a year.  Routine eye exams. Ask your health care provider how often you should have your eyes checked.  Personal lifestyle choices, including:  Daily care of your teeth and gums.  Regular physical activity.  Eating a healthy diet.  Avoiding tobacco and drug use.  Limiting alcohol use.  Practicing safe sex.  Taking  low-dose aspirin every day.  Taking vitamin and mineral supplements as recommended by your health care provider. What happens during an annual well check? The services and screenings done by your health care provider during your annual well check will depend on your age, overall health, lifestyle risk factors, and family history of disease. Counseling  Your health care provider may ask you questions about your:  Alcohol use.  Tobacco use.  Drug use.  Emotional well-being.  Home and relationship well-being.  Sexual activity.  Eating habits.  History of falls.  Memory and ability to understand (cognition).  Work and work Statistician.  Reproductive health. Screening  You may have the following tests or measurements:  Height, weight, and BMI.  Blood pressure.  Lipid and cholesterol levels. These may be checked every 5 years, or more frequently if you are over 55 years old.  Skin check.  Lung cancer screening. You may have this screening every year starting at age 32 if you have a 30-pack-year history of smoking and currently smoke or have quit within the past 15 years.  Fecal occult blood test (FOBT) of the stool. You may have this test every year starting at age 44.  Flexible sigmoidoscopy or colonoscopy. You may have a sigmoidoscopy every 5 years or a colonoscopy every 10 years starting at age 10.  Hepatitis C blood test.  Hepatitis B blood test.  Sexually transmitted disease (STD) testing.  Diabetes screening. This is done by checking your blood sugar (glucose) after you have not eaten for a while (fasting). You may have this done every 1-3 years.  Bone density scan. This is done to  screen for osteoporosis. You may have this done starting at age 59.  Mammogram. This may be done every 1-2 years. Talk to your health care provider about how often you should have regular mammograms. Talk with your health care provider about your test results, treatment options, and  if necessary, the need for more tests. Vaccines  Your health care provider may recommend certain vaccines, such as:  Influenza vaccine. This is recommended every year.  Tetanus, diphtheria, and acellular pertussis (Tdap, Td) vaccine. You may need a Td booster every 10 years.  Zoster vaccine. You may need this after age 40.  Pneumococcal 13-valent conjugate (PCV13) vaccine. One dose is recommended after age 74.  Pneumococcal polysaccharide (PPSV23) vaccine. One dose is recommended after age 38. Talk to your health care provider about which screenings and vaccines you need and how often you need them. This information is not intended to replace advice given to you by your health care provider. Make sure you discuss any questions you have with your health care provider. Document Released: 05/26/2015 Document Revised: 01/17/2016 Document Reviewed: 02/28/2015 Elsevier Interactive Patient Education  2017 [place] Prevention in the Home Falls can cause injuries. They can happen to people of all ages. There are many things you can do to make your home safe and to help prevent falls. What can I do on the outside of my home?  Regularly fix the edges of walkways and driveways and fix any cracks.  Remove anything that might make you trip as you walk through a door, such as a raised step or threshold.  Trim any bushes or trees on the path to your home.  Use bright outdoor lighting.  Clear any walking paths of anything that might make someone trip, such as rocks or tools.  Regularly check to see if handrails are loose or broken. Make sure that both sides of any steps have handrails.  Any raised decks and porches should have guardrails on the edges.  Have any leaves, snow, or ice cleared regularly.  Use sand or salt on walking paths during winter.  Clean up any spills in your garage right away. This includes oil or grease spills. What can I do in the bathroom?  Use night  lights.  Install grab bars by the toilet and in the tub and shower. Do not use towel bars as grab bars.  Use non-skid mats or decals in the tub or shower.  If you need to sit down in the shower, use a plastic, non-slip stool.  Keep the floor dry. Clean up any water that spills on the floor as soon as it happens.  Remove soap buildup in the tub or shower regularly.  Attach bath mats securely with double-sided non-slip rug tape.  Do not have throw rugs and other things on the floor that can make you trip. What can I do in the bedroom?  Use night lights.  Make sure that you have a light by your bed that is easy to reach.  Do not use any sheets or blankets that are too big for your bed. They should not hang down onto the floor.  Have a firm chair that has side arms. You can use this for support while you get dressed.  Do not have throw rugs and other things on the floor that can make you trip. What can I do in the kitchen?  Clean up any spills right away.  Avoid walking on wet floors.  Keep items that  you use a lot in easy-to-reach places.  If you need to reach something above you, use a strong step stool that has a grab bar.  Keep electrical cords out of the way.  Do not use floor polish or wax that makes floors slippery. If you must use wax, use non-skid floor wax.  Do not have throw rugs and other things on the floor that can make you trip. What can I do with my stairs?  Do not leave any items on the stairs.  Make sure that there are handrails on both sides of the stairs and use them. Fix handrails that are broken or loose. Make sure that handrails are as long as the stairways.  Check any carpeting to make sure that it is firmly attached to the stairs. Fix any carpet that is loose or worn.  Avoid having throw rugs at the top or bottom of the stairs. If you do have throw rugs, attach them to the floor with carpet tape.  Make sure that you have a light switch at the  top of the stairs and the bottom of the stairs. If you do not have them, ask someone to add them for you. What else can I do to help prevent falls?  Wear shoes that:  Do not have high heels.  Have rubber bottoms.  Are comfortable and fit you well.  Are closed at the toe. Do not wear sandals.  If you use a stepladder:  Make sure that it is fully opened. Do not climb a closed stepladder.  Make sure that both sides of the stepladder are locked into place.  Ask someone to hold it for you, if possible.  Clearly mark and make sure that you can see:  Any grab bars or handrails.  First and last steps.  Where the edge of each step is.  Use tools that help you move around (mobility aids) if they are needed. These include:  Canes.  Walkers.  Scooters.  Crutches.  Turn on the lights when you go into a dark area. Replace any light bulbs as soon as they burn out.  Set up your furniture so you have a clear path. Avoid moving your furniture around.  If any of your floors are uneven, fix them.  If there are any pets around you, be aware of where they are.  Review your medicines with your doctor. Some medicines can make you feel dizzy. This can increase your chance of falling. Ask your doctor what other things that you can do to help prevent falls. This information is not intended to replace advice given to you by your health care provider. Make sure you discuss any questions you have with your health care provider. Document Released: 02/23/2009 Document Revised: 10/05/2015 Document Reviewed: 06/03/2014 Elsevier Interactive Patient Education  2017 [employer].

## 2020-09-27 NOTE — Progress Notes (Signed)
Subjective:   Jonda Alanis is a 75 y.o. female who presents for Medicare Annual (Subsequent) preventive examination.  Virtual Visit via Telephone Note  I connected with  Jamara Vary Nie on 09/27/20 at  4:15 PM EDT by telephone and verified that I am speaking with the correct person using two identifiers.  Location: Patient: Home Provider: [facility] Persons participating in the virtual visit: patient/Nurse Health Advisor   I discussed the limitations, risks, security and privacy concerns of performing an evaluation and management service by telephone and the availability of in person appointments. The patient expressed understanding and agreed to proceed.  Interactive audio and video telecommunications were attempted between this nurse and patient, however failed, due to patient having technical difficulties OR patient did not have access to video capability.  We continued and completed visit with audio only.  Some vital signs may be absent or patient reported.   Sydne Krahl E Reena Borromeo, LPN   Review of Systems     Cardiac Risk Factors include: advanced age (>46men, >7 women);dyslipidemia;hypertension;sedentary lifestyle;obesity (BMI >30kg/m2)     Objective:    Today's Vitals   09/27/20 1625  Weight: 160 lb (72.6 kg)  Height: 5' (1.524 m)   Body mass index is 31.25 kg/m.  Advanced Directives 09/27/2020 09/03/2019  Does Patient Have a Medical Advance Directive? Yes Yes  Type of Paramedic of [place];Living will Living will  Does patient want to make changes to medical advance directive? - No - Patient declined  Copy of [place] in Chart? No - copy requested -  Would patient like information on creating a medical advance directive? - No - Patient declined    Current Medications (verified) Outpatient Encounter Medications as of 09/27/2020  Medication Sig  . aspirin EC 81 MG tablet Take 81 mg by mouth daily.  Marland Kitchen atorvastatin (LIPITOR)  40 MG tablet Take 1 tablet (40 mg total) by mouth daily.  . Cyanocobalamin (VITAMIN B 12 PO) Take by mouth.  . hydrochlorothiazide (HYDRODIURIL) 25 MG tablet Take 1 tablet (25 mg total) by mouth daily.  Marland Kitchen linaclotide (LINZESS) 72 MCG capsule Take 1 capsule (72 mcg total) by mouth daily before breakfast.  . loratadine (CLARITIN) 10 MG tablet Take 10 mg by mouth daily.  Marland Kitchen Lysine 500 MG TABS Take by mouth.  . metoprolol succinate (TOPROL-XL) 25 MG 24 hr tablet Take 1 tablet (25 mg total) by mouth daily.  . Multiple Vitamin (MULTIVITAMIN) tablet Take 1 tablet by mouth daily.  . Omega-3 Fatty Acids (FISH OIL) 1000 MG CAPS Take by mouth.  . Pyridoxine HCl (VITAMIN B-6 PO) Take by mouth.  . predniSONE (DELTASONE) 20 MG tablet 2 po at same time daily for 5 days (Patient not taking: Reported on 09/27/2020)   No facility-administered encounter medications on file as of 09/27/2020.    Allergies (verified) Elemental sulfur   History: Past Medical History:  Diagnosis Date  . Hyperlipidemia   . Hypertension    Past Surgical History:  Procedure Laterality Date  . ABDOMINAL HYSTERECTOMY  1991   Family History  Problem Relation Age of Onset  . Hypertension Mother   . Hyperlipidemia Father   . Hypertension Father    Social History   Socioeconomic History  . Marital status: Married    Spouse name: Not on file  . Number of children: 1  . Years of education: Not on file  . Highest education level: Not on file  Occupational History  . Occupation:  Realtor  Tobacco Use  . Smoking status: Never Smoker  . Smokeless tobacco: Never Used  Substance and Sexual Activity  . Alcohol use: No  . Drug use: No  . Sexual activity: Never  Other Topics Concern  . Not on file  Social History Narrative   Lives with husband, Marc Morgans - have one son who lives in [place]   She is still working full time - Cabin crew   Social Determinants of Radio broadcast assistant Strain: Low Risk   . Difficulty of Paying  Living Expenses: Not hard at all  Food Insecurity: No Food Insecurity  . Worried About Charity fundraiser in the Last Year: Never true  . Ran Out of Food in the Last Year: Never true  Transportation Needs: No Transportation Needs  . Lack of Transportation (Medical): No  . Lack of Transportation (Non-Medical): No  Physical Activity: Inactive  . Days of Exercise per Week: 0 days  . Minutes of Exercise per Session: 0 min  Stress: No Stress Concern Present  . Feeling of Stress : Not at all  Social Connections: Socially Integrated  . Frequency of Communication with Friends and Family: More than three times a week  . Frequency of Social Gatherings with Friends and Family: More than three times a week  . Attends Religious Services: 1 to 4 times per year  . Active Member of Clubs or Organizations: Yes  . Attends Archivist Meetings: More than 4 times per year  . Marital Status: Married    Tobacco Counseling Counseling given: Not Answered   Clinical Intake:  Pre-visit preparation completed: Yes  Pain : No/denies pain     BMI - recorded: 31.25 Nutritional Status: BMI > 30  Obese Nutritional Risks: None Diabetes: No  How often do you need to have someone help you when you read instructions, pamphlets, or other written materials from your doctor or pharmacy?: 1 - Never  Diabetic? no  Interpreter Needed?: No  Information entered by :: Aunesti Pellegrino, LPN   Activities of Daily Living In your present state of health, do you have any difficulty performing the following activities: 09/27/2020  Hearing? N  Vision? N  Difficulty concentrating or making decisions? N  Walking or climbing stairs? N  Dressing or bathing? N  Doing errands, shopping? N  Preparing Food and eating ? N  Using the Toilet? N  In the past six months, have you accidently leaked urine? N  Do you have problems with loss of bowel control? N  Managing your Medications? N  Managing your Finances? N   Housekeeping or managing your Housekeeping? N  Some recent data might be hidden    Patient Care Team: Dettinger, Fransisca Kaufmann, MD as PCP - General (Family Medicine)  Indicate any recent Medical Services you may have received from other than [facility] providers in the past year (date may be approximate).     Assessment:   This is a routine wellness examination for [facility].  Hearing/Vision screen  Hearing Screening   125Hz  250Hz  500Hz  1000Hz  2000Hz  3000Hz  4000Hz  6000Hz  8000Hz   Right ear:           Left ear:           Comments: Denies hearing difficulties   Vision Screening Comments: Wears eyeglasses - Annual visits with Dr Marin Comment in [place] - up to date with eye exam  Dietary issues and exercise activities discussed: Current Exercise Habits: The patient does not participate in regular exercise at  present  Goals Addressed            This Visit's Progress   . Exercise 3x per week (30 min per time)        Depression Screen PHQ 2/9 Scores 09/27/2020 03/03/2020 09/03/2019 09/02/2019 01/20/2019 07/20/2018 07/02/2018  PHQ - 2 Score 0 0 0 0 0 0 0    Fall Risk Fall Risk  09/27/2020 03/03/2020 09/03/2019 09/02/2019 01/20/2019  Falls in the past year? 0 0 0 0 0  Number falls in past yr: 0 - - - -  Injury with Fall? 0 - - - -    FALL RISK PREVENTION PERTAINING TO THE HOME:  Any stairs in or around the home? No  If so, are there any without handrails? No  Home free of loose throw rugs in walkways, pet beds, electrical cords, etc? Yes  Adequate lighting in your home to reduce risk of falls? Yes   ASSISTIVE DEVICES UTILIZED TO PREVENT FALLS:  Life alert? No  Use of a cane, walker or w/c? No  Grab bars in the bathroom? Yes  Shower chair or bench in shower? Yes  Elevated toilet seat or a handicapped toilet? Yes   TIMED UP AND GO:  Was the test performed? No . Telephonic visit  Cognitive Function: Normal cognitive status assessed by direct observation by this Nurse Health Advisor. No abnormalities  found.      6CIT Screen 09/03/2019  What Year? 0 points  What month? 0 points  What time? 0 points  Count back from 20 0 points  Months in reverse 0 points  Repeat phrase 0 points  Total Score 0    Immunizations Immunization History  Administered Date(s) Administered  . Fluad Quad(high Dose 65+) 02/17/2019, 03/03/2020  . Influenza, High Dose Seasonal PF 02/25/2017, 02/24/2018  . Influenza,inj,Quad PF,6+ Mos 02/11/2016  . Moderna Sars-Covid-2 Vaccination 07/15/2019, 08/20/2019, 04/14/2020  . Pneumococcal Conjugate-13 03/01/2014  . Pneumococcal Polysaccharide-23 01/20/2019    TDAP status: Due, Education has been provided regarding the importance of this vaccine. Advised may receive this vaccine at local pharmacy or Health Dept. Aware to provide a copy of the vaccination record if obtained from local pharmacy or Health Dept. Verbalized acceptance and understanding.  Flu Vaccine status: Up to date  Pneumococcal vaccine status: Up to date  Covid-19 vaccine status: Completed vaccines  Qualifies for Shingles Vaccine? Yes   Zostavax completed No   Shingrix Completed?: No.    Education has been provided regarding the importance of this vaccine. Patient has been advised to call insurance company to determine out of pocket expense if they have not yet received this vaccine. Advised may also receive vaccine at local pharmacy or Health Dept. Verbalized acceptance and understanding.  Screening Tests Health Maintenance  Topic Date Due  . TETANUS/TDAP  Never done  . DEXA SCAN  07/19/2020  . MAMMOGRAM  12/07/2020  . INFLUENZA VACCINE  12/11/2020  . COLONOSCOPY (Pts 45-31yrs Insurance coverage will need to be confirmed)  07/25/2021  . COVID-19 Vaccine  Completed  . Hepatitis C Screening  Completed  . PNA vac Low Risk Adult  Completed  . HPV VACCINES  Aged Out    Health Maintenance  Health Maintenance Due  Topic Date Due  . TETANUS/TDAP  Never done  . DEXA SCAN  07/19/2020     Colorectal cancer screening: Type of screening: Colonoscopy. Completed 07/26/2011. Repeat every 10 years  Mammogram status: Completed 12/08/2019. Repeat every year  Bone Density status: Completed 07/20/2018. Results  reflect: Bone density results: OSTEOPENIA. Repeat every 2 years.  Lung Cancer Screening: (Low Dose CT Chest recommended if Age 18-80 years, 30 pack-year currently smoking OR have quit w/in 15years.) does not qualify.   Additional Screening:  Hepatitis C Screening: does qualify; Completed 03/03/2020  Vision Screening: Recommended annual ophthalmology exams for early detection of glaucoma and other disorders of the eye. Is the patient up to date with their annual eye exam?  Yes  Who is the provider or what is the name of the office in which the patient attends annual eye exams? Dr Marin Comment If pt is not established with a provider, would they like to be referred to a provider to establish care? No .   Dental Screening: Recommended annual dental exams for proper oral hygiene  Community Resource Referral / Chronic Care Management: CRR required this visit?  No   CCM required this visit?  No      Plan:     I have personally reviewed and noted the following in the patient's chart:   . Medical and social history . Use of alcohol, tobacco or illicit drugs  . Current medications and supplements including opioid prescriptions.  . Functional ability and status . Nutritional status . Physical activity . Advanced directives . List of other physicians . Hospitalizations, surgeries, and ER visits in previous 12 months . Vitals . Screenings to include cognitive, depression, and falls . Referrals and appointments  In addition, I have reviewed and discussed with patient certain preventive protocols, quality metrics, and best practice recommendations. A written personalized care plan for preventive services as well as general preventive health recommendations were provided to  patient.     Sandrea Hammond, LPN   [id number]   Nurse Notes: None

## 2020-12-23 DIAGNOSIS — N390 Urinary tract infection, site not specified: Secondary | ICD-10-CM | POA: Diagnosis not present

## 2021-01-01 ENCOUNTER — Other Ambulatory Visit: Payer: Self-pay | Admitting: Family Medicine

## 2021-01-01 DIAGNOSIS — Z1231 Encounter for screening mammogram for malignant neoplasm of breast: Secondary | ICD-10-CM

## 2021-03-01 ENCOUNTER — Other Ambulatory Visit: Payer: Self-pay

## 2021-03-01 ENCOUNTER — Encounter: Payer: Self-pay | Admitting: Family Medicine

## 2021-03-01 ENCOUNTER — Ambulatory Visit (INDEPENDENT_AMBULATORY_CARE_PROVIDER_SITE_OTHER): Payer: Medicare Other | Admitting: Family Medicine

## 2021-03-01 ENCOUNTER — Telehealth: Payer: Self-pay | Admitting: Family Medicine

## 2021-03-01 VITALS — BP 139/83 | HR 79 | Ht 60.0 in | Wt 163.0 lb

## 2021-03-01 DIAGNOSIS — M8588 Other specified disorders of bone density and structure, other site: Secondary | ICD-10-CM

## 2021-03-01 DIAGNOSIS — I10 Essential (primary) hypertension: Secondary | ICD-10-CM | POA: Diagnosis not present

## 2021-03-01 DIAGNOSIS — K589 Irritable bowel syndrome without diarrhea: Secondary | ICD-10-CM

## 2021-03-01 DIAGNOSIS — E782 Mixed hyperlipidemia: Secondary | ICD-10-CM | POA: Diagnosis not present

## 2021-03-01 NOTE — Progress Notes (Signed)
BP 139/83   Pulse 79   Ht 5' (1.524 m)   Wt 163 lb (73.9 kg)   SpO2 96%   BMI 31.83 kg/m    Subjective:   Patient ID: Teresa Quinn, female    DOB: 1946/05/08, 75 y.o.   MRN: [id number]  HPI: Teresa Quinn is a 75 y.o. female presenting on 03/01/2021 for Medical Management of Chronic Issues, Hyperlipidemia, and Hypertension   HPI Hypertension Patient is currently on hydrochlorothiazide and metoprolol, and their blood pressure today is 139/83. Patient denies any lightheadedness or dizziness. Patient denies headaches, blurred vision, chest pains, shortness of breath, or weakness. Denies any side effects from medication and is content with current medication.   Hyperlipidemia Patient is coming in for recheck of his hyperlipidemia. The patient is currently taking atorvastatin and fish oil. They deny any issues with myalgias or history of liver damage from it. They deny any focal numbness or weakness or chest pain.   Irritable bowel Patient has irritable bowels and takes Linzess for this, she has primarily constipation and that is why she takes Linzess.  Doing well for her and she feels like things are under control  Osteoporosis/osteopenia Fractures or history of fracture: None Medication: Vitamin D calcium Duration of treatment: 2 years Last bone density scan: 07/20/2018 Last T score: -1.3  Relevant past medical, surgical, family and social history reviewed and updated as indicated. Interim medical history since our last visit reviewed. Allergies and medications reviewed and updated.  Review of Systems  Constitutional:  Negative for chills and fever.  Eyes:  Negative for visual disturbance.  Respiratory:  Negative for chest tightness and shortness of breath.   Cardiovascular:  Negative for chest pain and leg swelling.  Genitourinary:  Negative for difficulty urinating and dysuria.  Musculoskeletal:  Negative for back pain and gait problem.  Skin:  Negative for rash.   Neurological:  Negative for light-headedness and headaches.  Psychiatric/Behavioral:  Negative for agitation and behavioral problems.   All other systems reviewed and are negative.  Per HPI unless specifically indicated above   Allergies as of 03/01/2021       Reactions   Elemental Sulfur Rash        Medication List        Accurate as of March 01, 2021  3:52 PM. If you have any questions, ask your nurse or doctor.          STOP taking these medications    predniSONE 20 MG tablet Commonly known as: DELTASONE Stopped by: Fransisca Kaufmann Evolette Pendell, MD   VITAMIN B-6 PO Stopped by: Fransisca Kaufmann Tieler Cournoyer, MD       TAKE these medications    aspirin EC 81 MG tablet Take 81 mg by mouth daily.   atorvastatin 40 MG tablet Commonly known as: LIPITOR Take 1 tablet (40 mg total) by mouth daily.   Fish Oil 1000 MG Caps Take by mouth.   hydrochlorothiazide 25 MG tablet Commonly known as: HYDRODIURIL Take 1 tablet (25 mg total) by mouth daily.   linaclotide 72 MCG capsule Commonly known as: Linzess Take 1 capsule (72 mcg total) by mouth daily before breakfast.   loratadine 10 MG tablet Commonly known as: CLARITIN Take 10 mg by mouth daily.   Lysine 500 MG Tabs Take by mouth.   metoprolol succinate 25 MG 24 hr tablet Commonly known as: TOPROL-XL Take 1 tablet (25 mg total) by mouth daily.   multivitamin tablet Take 1 tablet by mouth daily.  VITAMIN B 12 PO Take by mouth.         Objective:   BP 139/83   Pulse 79   Ht 5' (1.524 m)   Wt 163 lb (73.9 kg)   SpO2 96%   BMI 31.83 kg/m   Wt Readings from Last 3 Encounters:  03/01/21 163 lb (73.9 kg)  09/27/20 160 lb (72.6 kg)  03/03/20 169 lb (76.7 kg)    Physical Exam Vitals and nursing note reviewed.  Constitutional:      General: She is not in acute distress.    Appearance: She is well-developed. She is not diaphoretic.  Eyes:     Conjunctiva/sclera: Conjunctivae normal.  Cardiovascular:      Rate and Rhythm: Normal rate and regular rhythm.     Heart sounds: Normal heart sounds. No murmur heard. Pulmonary:     Effort: Pulmonary effort is normal. No respiratory distress.     Breath sounds: Normal breath sounds. No wheezing.  Musculoskeletal:        General: No tenderness. Normal range of motion.  Skin:    General: Skin is warm and dry.     Findings: No rash.  Neurological:     Mental Status: She is alert and oriented to person, place, and time.     Coordination: Coordination normal.  Psychiatric:        Behavior: Behavior normal.      Assessment & Plan:   Problem List Items Addressed This Visit       Cardiovascular and Mediastinum   Essential hypertension - Primary   Relevant Orders   CMP14+EGFR   TSH     Digestive   Irritable bowel syndrome   Relevant Orders   CBC with Differential/Platelet   TSH     Other   Hyperlipidemia   Relevant Orders   Lipid panel   TSH   Other Visit Diagnoses     Osteopenia of lumbar spine       Relevant Orders   DG [facility] DEXA       We will do blood work today, it seems like things are going well, no changes, will wait for blood work to make any changes. Follow up plan: Return in about 6 months (around 08/30/2021), or if symptoms worsen or fail to improve, for Hyperlipidemia and hypertension.  Counseling provided for all of the vaccine components Orders Placed This Encounter  Procedures   DG [facility] DEXA   CBC with Differential/Platelet   CMP14+EGFR   Lipid panel   TSH    Caryl Pina, MD [facility] 03/01/2021, 3:52 PM

## 2021-03-01 NOTE — Telephone Encounter (Signed)
Pt needs a dexa scan per Dr. Warrick Parisian.

## 2021-03-02 LAB — LIPID PANEL
Chol/HDL Ratio: 2.6 ratio (ref 0.0–4.4)
Cholesterol, Total: 174 mg/dL (ref 100–199)
HDL: 66 mg/dL (ref >39)
LDL Chol Calc (NIH): 90 mg/dL (ref 0–99)
Triglycerides: 98 mg/dL (ref 0–149)
VLDL Cholesterol Cal: 18 mg/dL (ref 5–40)

## 2021-03-02 LAB — CBC WITH DIFFERENTIAL/PLATELET
Basophils Absolute: 0.1 10*3/uL (ref 0.0–0.2)
Basos: 1 %
EOS (ABSOLUTE): 0.1 10*3/uL (ref 0.0–0.4)
Eos: 1 %
Hematocrit: 40.3 % (ref 34.0–46.6)
Hemoglobin: 13.9 g/dL (ref 11.1–15.9)
Immature Grans (Abs): 0 10*3/uL (ref 0.0–0.1)
Immature Granulocytes: 0 %
Lymphocytes Absolute: 1.7 10*3/uL (ref 0.7–3.1)
Lymphs: 24 %
MCH: 31.4 pg (ref 26.6–33.0)
MCHC: 34.5 g/dL (ref 31.5–35.7)
MCV: 91 fL (ref 79–97)
Monocytes Absolute: 0.6 10*3/uL (ref 0.1–0.9)
Monocytes: 9 %
Neutrophils Absolute: 4.6 10*3/uL (ref 1.4–7.0)
Neutrophils: 65 %
Platelets: 316 10*3/uL (ref 150–450)
RBC: 4.42 x10E6/uL (ref 3.77–5.28)
RDW: 12.1 % (ref 11.7–15.4)
WBC: 7.1 10*3/uL (ref 3.4–10.8)

## 2021-03-02 LAB — CMP14+EGFR
ALT: 25 IU/L (ref 0–32)
AST: 35 IU/L (ref 0–40)
Albumin/Globulin Ratio: 1.8 (ref 1.2–2.2)
Albumin: 4.6 g/dL (ref 3.7–4.7)
Alkaline Phosphatase: 91 IU/L (ref 44–121)
BUN/Creatinine Ratio: 18 (ref 12–28)
BUN: 14 mg/dL (ref 8–27)
Bilirubin Total: 0.4 mg/dL (ref 0.0–1.2)
CO2: 25 mmol/L (ref 20–29)
Calcium: 9.8 mg/dL (ref 8.7–10.3)
Chloride: 96 mmol/L (ref 96–106)
Creatinine, Ser: 0.78 mg/dL (ref 0.57–1.00)
Globulin, Total: 2.6 g/dL (ref 1.5–4.5)
Glucose: 82 mg/dL (ref 70–99)
Potassium: 4.4 mmol/L (ref 3.5–5.2)
Sodium: 136 mmol/L (ref 134–144)
Total Protein: 7.2 g/dL (ref 6.0–8.5)
eGFR: 80 mL/min/{1.73_m2} (ref >59)

## 2021-03-02 LAB — TSH: TSH: 2.22 u[IU]/mL (ref 0.450–4.500)

## 2021-03-07 ENCOUNTER — Other Ambulatory Visit: Payer: Self-pay

## 2021-03-07 ENCOUNTER — Ambulatory Visit
Admission: RE | Admit: 2021-03-07 | Discharge: 2021-03-07 | Disposition: A | Discharge status: Home / Self Care | Payer: Medicare Other | Source: Ambulatory Visit | Attending: Family Medicine | Admitting: Family Medicine

## 2021-03-07 ENCOUNTER — Encounter: Payer: Medicare Other | Admitting: Family Medicine

## 2021-03-07 DIAGNOSIS — Z1231 Encounter for screening mammogram for malignant neoplasm of breast: Secondary | ICD-10-CM

## 2021-03-07 DIAGNOSIS — H2513 Age-related nuclear cataract, bilateral: Secondary | ICD-10-CM | POA: Diagnosis not present

## 2021-03-07 DIAGNOSIS — H40033 Anatomical narrow angle, bilateral: Secondary | ICD-10-CM | POA: Diagnosis not present

## 2021-03-08 NOTE — Progress Notes (Signed)
Erroneous. Scheduled in error.

## 2021-03-14 ENCOUNTER — Ambulatory Visit (INDEPENDENT_AMBULATORY_CARE_PROVIDER_SITE_OTHER): Payer: Medicare Other

## 2021-03-14 ENCOUNTER — Other Ambulatory Visit: Payer: Self-pay

## 2021-03-14 DIAGNOSIS — M8588 Other specified disorders of bone density and structure, other site: Secondary | ICD-10-CM | POA: Diagnosis not present

## 2021-03-14 DIAGNOSIS — Z780 Asymptomatic menopausal state: Secondary | ICD-10-CM | POA: Diagnosis not present

## 2021-05-17 DIAGNOSIS — L570 Actinic keratosis: Secondary | ICD-10-CM | POA: Diagnosis not present

## 2021-05-17 DIAGNOSIS — L820 Inflamed seborrheic keratosis: Secondary | ICD-10-CM | POA: Diagnosis not present

## 2021-05-17 DIAGNOSIS — D225 Melanocytic nevi of trunk: Secondary | ICD-10-CM | POA: Diagnosis not present

## 2021-05-17 DIAGNOSIS — X32XXXA Exposure to sunlight, initial encounter: Secondary | ICD-10-CM | POA: Diagnosis not present

## 2021-05-17 DIAGNOSIS — D0472 Carcinoma in situ of skin of left lower limb, including hip: Secondary | ICD-10-CM | POA: Diagnosis not present

## 2021-06-20 DIAGNOSIS — M9904 Segmental and somatic dysfunction of sacral region: Secondary | ICD-10-CM | POA: Diagnosis not present

## 2021-06-20 DIAGNOSIS — M9905 Segmental and somatic dysfunction of pelvic region: Secondary | ICD-10-CM | POA: Diagnosis not present

## 2021-06-20 DIAGNOSIS — M5137 Other intervertebral disc degeneration, lumbosacral region: Secondary | ICD-10-CM | POA: Diagnosis not present

## 2021-06-20 DIAGNOSIS — M9903 Segmental and somatic dysfunction of lumbar region: Secondary | ICD-10-CM | POA: Diagnosis not present

## 2021-06-21 DIAGNOSIS — M5137 Other intervertebral disc degeneration, lumbosacral region: Secondary | ICD-10-CM | POA: Diagnosis not present

## 2021-06-21 DIAGNOSIS — M9904 Segmental and somatic dysfunction of sacral region: Secondary | ICD-10-CM | POA: Diagnosis not present

## 2021-06-21 DIAGNOSIS — M9903 Segmental and somatic dysfunction of lumbar region: Secondary | ICD-10-CM | POA: Diagnosis not present

## 2021-06-21 DIAGNOSIS — M9905 Segmental and somatic dysfunction of pelvic region: Secondary | ICD-10-CM | POA: Diagnosis not present

## 2021-07-19 DIAGNOSIS — M9903 Segmental and somatic dysfunction of lumbar region: Secondary | ICD-10-CM | POA: Diagnosis not present

## 2021-07-19 DIAGNOSIS — M5137 Other intervertebral disc degeneration, lumbosacral region: Secondary | ICD-10-CM | POA: Diagnosis not present

## 2021-07-19 DIAGNOSIS — M9904 Segmental and somatic dysfunction of sacral region: Secondary | ICD-10-CM | POA: Diagnosis not present

## 2021-07-19 DIAGNOSIS — M9905 Segmental and somatic dysfunction of pelvic region: Secondary | ICD-10-CM | POA: Diagnosis not present

## 2021-08-16 DIAGNOSIS — M9903 Segmental and somatic dysfunction of lumbar region: Secondary | ICD-10-CM | POA: Diagnosis not present

## 2021-08-16 DIAGNOSIS — M5137 Other intervertebral disc degeneration, lumbosacral region: Secondary | ICD-10-CM | POA: Diagnosis not present

## 2021-08-16 DIAGNOSIS — M9905 Segmental and somatic dysfunction of pelvic region: Secondary | ICD-10-CM | POA: Diagnosis not present

## 2021-08-16 DIAGNOSIS — M9904 Segmental and somatic dysfunction of sacral region: Secondary | ICD-10-CM | POA: Diagnosis not present

## 2021-08-30 ENCOUNTER — Ambulatory Visit (INDEPENDENT_AMBULATORY_CARE_PROVIDER_SITE_OTHER): Payer: Medicare Other | Admitting: Family Medicine

## 2021-08-30 ENCOUNTER — Encounter: Payer: Self-pay | Admitting: Family Medicine

## 2021-08-30 VITALS — BP 141/84 | HR 73 | Wt 167.0 lb

## 2021-08-30 DIAGNOSIS — I10 Essential (primary) hypertension: Secondary | ICD-10-CM

## 2021-08-30 DIAGNOSIS — Z23 Encounter for immunization: Secondary | ICD-10-CM | POA: Diagnosis not present

## 2021-08-30 DIAGNOSIS — E782 Mixed hyperlipidemia: Secondary | ICD-10-CM | POA: Diagnosis not present

## 2021-08-30 DIAGNOSIS — R195 Other fecal abnormalities: Secondary | ICD-10-CM

## 2021-08-30 DIAGNOSIS — K589 Irritable bowel syndrome without diarrhea: Secondary | ICD-10-CM | POA: Diagnosis not present

## 2021-08-30 DIAGNOSIS — Z1211 Encounter for screening for malignant neoplasm of colon: Secondary | ICD-10-CM

## 2021-08-30 MED ORDER — METOPROLOL SUCCINATE ER 25 MG PO TB24: 25.0000 mg | ORAL_TABLET | Freq: Every day | ORAL | 3 refills | Status: DC

## 2021-08-30 MED ORDER — ATORVASTATIN CALCIUM 40 MG PO TABS: 40.0000 mg | ORAL_TABLET | Freq: Every day | ORAL | 3 refills | Status: DC

## 2021-08-30 MED ORDER — LINACLOTIDE 72 MCG PO CAPS: 72.0000 ug | ORAL_CAPSULE | Freq: Every day | ORAL | 3 refills | Status: DC

## 2021-08-30 MED ORDER — HYDROCHLOROTHIAZIDE 25 MG PO TABS: 25.0000 mg | ORAL_TABLET | Freq: Every day | ORAL | 3 refills | Status: DC

## 2021-08-30 NOTE — Progress Notes (Signed)
? ?BP (!) 141/84   Pulse 73   Wt 167 lb (75.8 kg)   SpO2 97%   BMI 32.61 kg/m?   ? ?Subjective:  ? ?Patient ID: Teresa Quinn, female    DOB: 11/29/1945, 76 y.o.   MRN: [id number] ? ?HPI: ?Teresa Quinn is a 76 y.o. female presenting on 08/30/2021 for Medical Management of Chronic Issues, Hyperlipidemia, and Hypertension ? ? ?HPI ?Hypertension ?Patient is currently on hydrochlorothiazide and metoprolol, and their blood pressure today is 141/84 but she says normally she runs pretty good at home.. Patient denies any lightheadedness or dizziness. Patient denies headaches, blurred vision, chest pains, shortness of breath, or weakness. Denies any side effects from medication and is content with current medication.  ? ?Hyperlipidemia ?Patient is coming in for recheck of his hyperlipidemia. The patient is currently taking atorvastatin and fish oil. They deny any issues with myalgias or history of liver damage from it. They deny any focal numbness or weakness or chest pain.  ? ?IBS ?Patient has IBS and takes Linzess.  She says it is working well for her and takes it about 3 times a week and it seems to be pretty consistent with her by taking it that frequently.   ? ?Relevant past medical, surgical, family and social history reviewed and updated as indicated. Interim medical history since our last visit reviewed. ?Allergies and medications reviewed and updated. ? ?Review of Systems  ?Constitutional:  Negative for chills and fever.  ?Eyes:  Negative for visual disturbance.  ?Respiratory:  Negative for chest tightness and shortness of breath.   ?Cardiovascular:  Negative for chest pain and leg swelling.  ?Musculoskeletal:  Negative for back pain and gait problem.  ?Skin:  Negative for rash.  ?Neurological:  Negative for dizziness, light-headedness and headaches.  ?Psychiatric/Behavioral:  Negative for agitation and behavioral problems.   ?All other systems reviewed and are negative. ? ?Per HPI unless specifically  indicated above ? ? ?Allergies as of 08/30/2021   ? ?   Reactions  ? Elemental Sulfur Rash  ? ?  ? ?  ?Medication List  ?  ? ?  ? Accurate as of August 30, 2021  1:48 PM. If you have any questions, ask your nurse or doctor.  ?  ?  ? ?  ? ?aspirin EC 81 MG tablet ?Take 81 mg by mouth daily. ?  ?atorvastatin 40 MG tablet ?Commonly known as: LIPITOR ?Take 1 tablet (40 mg total) by mouth daily. ?  ?Fish Oil 1000 MG Caps ?Take by mouth. ?  ?hydrochlorothiazide 25 MG tablet ?Commonly known as: HYDRODIURIL ?Take 1 tablet (25 mg total) by mouth daily. ?  ?linaclotide 72 MCG capsule ?Commonly known as: Linzess ?Take 1 capsule (72 mcg total) by mouth daily before breakfast. ?  ?loratadine 10 MG tablet ?Commonly known as: CLARITIN ?Take 10 mg by mouth daily. ?  ?Lysine 500 MG Tabs ?Take by mouth. ?  ?metoprolol succinate 25 MG 24 hr tablet ?Commonly known as: TOPROL-XL ?Take 1 tablet (25 mg total) by mouth daily. ?  ?multivitamin tablet ?Take 1 tablet by mouth daily. ?  ?VITAMIN B 12 PO ?Take by mouth. ?  ? ?  ? ? ? ?Objective:  ? ?BP (!) 141/84   Pulse 73   Wt 167 lb (75.8 kg)   SpO2 97%   BMI 32.61 kg/m?   ?Wt Readings from Last 3 Encounters:  ?08/30/21 167 lb (75.8 kg)  ?03/01/21 163 lb (73.9 kg)  ?09/27/20 160 lb (72.6  kg)  ?  ?Physical Exam ?Vitals and nursing note reviewed.  ?Constitutional:   ?   General: She is not in acute distress. ?   Appearance: She is well-developed. She is not diaphoretic.  ?Eyes:  ?   Conjunctiva/sclera: Conjunctivae normal.  ?Cardiovascular:  ?   Rate and Rhythm: Normal rate and regular rhythm.  ?   Heart sounds: Normal heart sounds. No murmur heard. ?Pulmonary:  ?   Effort: Pulmonary effort is normal. No respiratory distress.  ?   Breath sounds: Normal breath sounds. No wheezing.  ?Musculoskeletal:     ?   General: No tenderness. Normal range of motion.  ?Skin: ?   General: Skin is warm and dry.  ?   Findings: No rash.  ?Neurological:  ?   Mental Status: She is alert and oriented to person,  place, and time.  ?   Coordination: Coordination normal.  ?Psychiatric:     ?   Behavior: Behavior normal.  ? ? ? ? ?Assessment & Plan:  ? ?Problem List Items Addressed This Visit   ? ?  ? Cardiovascular and Mediastinum  ? Essential hypertension - Primary  ? Relevant Medications  ? atorvastatin (LIPITOR) 40 MG tablet  ? hydrochlorothiazide (HYDRODIURIL) 25 MG tablet  ? metoprolol succinate (TOPROL-XL) 25 MG 24 hr tablet  ? Other Relevant Orders  ? CMP14+EGFR  ? CBC with Differential/Platelet  ?  ? Digestive  ? Irritable bowel syndrome  ? Relevant Medications  ? linaclotide (LINZESS) 72 MCG capsule  ?  ? Other  ? Hyperlipidemia  ? Relevant Medications  ? atorvastatin (LIPITOR) 40 MG tablet  ? hydrochlorothiazide (HYDRODIURIL) 25 MG tablet  ? metoprolol succinate (TOPROL-XL) 25 MG 24 hr tablet  ? Other Relevant Orders  ? CMP14+EGFR  ? Lipid panel  ? ?Other Visit Diagnoses   ? ? Colon cancer screening      ? Relevant Orders  ? Cologuard  ? Need for shingles vaccine      ? Relevant Orders  ? Varicella-zoster vaccine IM (Shingrix)  ? ?  ?[name] be doing well, blood pressure usually runs good at home, slightly up today but not enough to need to change.  Continue current medicine, will check blood work. ? ?Follow up plan: ?Return in about 6 months (around 03/01/2022), or if symptoms worsen or fail to improve, for Hypertension and cholesterol recheck. ? ?Counseling provided for all of the vaccine components ?Orders Placed This Encounter  ?Procedures  ? Varicella-zoster vaccine IM (Shingrix)  ? CMP14+EGFR  ? CBC with Differential/Platelet  ? Lipid panel  ? Cologuard  ? ? ?Caryl Pina, MD ?[facility] ?08/30/2021, 1:48 PM ? ? ?  ?

## 2021-08-31 LAB — CBC WITH DIFFERENTIAL/PLATELET
Basophils Absolute: 0.1 10*3/uL (ref 0.0–0.2)
Basos: 1 %
EOS (ABSOLUTE): 0.1 10*3/uL (ref 0.0–0.4)
Eos: 2 %
Hematocrit: 38.7 % (ref 34.0–46.6)
Hemoglobin: 13.4 g/dL (ref 11.1–15.9)
Immature Grans (Abs): 0 10*3/uL (ref 0.0–0.1)
Immature Granulocytes: 0 %
Lymphocytes Absolute: 1.9 10*3/uL (ref 0.7–3.1)
Lymphs: 26 %
MCH: 32.1 pg (ref 26.6–33.0)
MCHC: 34.6 g/dL (ref 31.5–35.7)
MCV: 93 fL (ref 79–97)
Monocytes Absolute: 0.6 10*3/uL (ref 0.1–0.9)
Monocytes: 8 %
Neutrophils Absolute: 4.7 10*3/uL (ref 1.4–7.0)
Neutrophils: 63 %
Platelets: 296 10*3/uL (ref 150–450)
RBC: 4.18 x10E6/uL (ref 3.77–5.28)
RDW: 11.8 % (ref 11.7–15.4)
WBC: 7.4 10*3/uL (ref 3.4–10.8)

## 2021-08-31 LAB — CMP14+EGFR
ALT: 26 IU/L (ref 0–32)
AST: 34 IU/L (ref 0–40)
Albumin/Globulin Ratio: 2 (ref 1.2–2.2)
Albumin: 4.7 g/dL (ref 3.7–4.7)
Alkaline Phosphatase: 91 IU/L (ref 44–121)
BUN/Creatinine Ratio: 16 (ref 12–28)
BUN: 12 mg/dL (ref 8–27)
Bilirubin Total: 0.5 mg/dL (ref 0.0–1.2)
CO2: 27 mmol/L (ref 20–29)
Calcium: 10.2 mg/dL (ref 8.7–10.3)
Chloride: 96 mmol/L (ref 96–106)
Creatinine, Ser: 0.77 mg/dL (ref 0.57–1.00)
Globulin, Total: 2.4 g/dL (ref 1.5–4.5)
Glucose: 83 mg/dL (ref 70–99)
Potassium: 4.4 mmol/L (ref 3.5–5.2)
Sodium: 138 mmol/L (ref 134–144)
Total Protein: 7.1 g/dL (ref 6.0–8.5)
eGFR: 80 mL/min/{1.73_m2} (ref >59)

## 2021-08-31 LAB — LIPID PANEL
Chol/HDL Ratio: 2.6 ratio (ref 0.0–4.4)
Cholesterol, Total: 167 mg/dL (ref 100–199)
HDL: 64 mg/dL (ref >39)
LDL Chol Calc (NIH): 89 mg/dL (ref 0–99)
Triglycerides: 72 mg/dL (ref 0–149)
VLDL Cholesterol Cal: 14 mg/dL (ref 5–40)

## 2021-09-12 DIAGNOSIS — M9903 Segmental and somatic dysfunction of lumbar region: Secondary | ICD-10-CM | POA: Diagnosis not present

## 2021-09-12 DIAGNOSIS — M5137 Other intervertebral disc degeneration, lumbosacral region: Secondary | ICD-10-CM | POA: Diagnosis not present

## 2021-09-12 DIAGNOSIS — M9905 Segmental and somatic dysfunction of pelvic region: Secondary | ICD-10-CM | POA: Diagnosis not present

## 2021-09-12 DIAGNOSIS — M9904 Segmental and somatic dysfunction of sacral region: Secondary | ICD-10-CM | POA: Diagnosis not present

## 2021-09-25 DIAGNOSIS — Z1211 Encounter for screening for malignant neoplasm of colon: Secondary | ICD-10-CM | POA: Diagnosis not present

## 2021-09-28 ENCOUNTER — Ambulatory Visit (INDEPENDENT_AMBULATORY_CARE_PROVIDER_SITE_OTHER): Payer: Medicare Other

## 2021-09-28 VITALS — Wt 167.0 lb

## 2021-09-28 DIAGNOSIS — Z0000 Encounter for general adult medical examination without abnormal findings: Secondary | ICD-10-CM | POA: Diagnosis not present

## 2021-09-28 NOTE — Progress Notes (Signed)
Subjective:   Teresa Quinn is a 76 y.o. female who presents for Medicare Annual (Subsequent) preventive examination.  Virtual Visit via Telephone Note  I connected with  Takyah Ciaramitaro Casto on 09/28/21 at  2:45 PM EDT by telephone and verified that I am speaking with the correct person using two identifiers.  Location: Patient: Home Provider: [facility] Persons participating in the virtual visit: patient/Nurse Health Advisor   I discussed the limitations, risks, security and privacy concerns of performing an evaluation and management service by telephone and the availability of in person appointments. The patient expressed understanding and agreed to proceed.  Interactive audio and video telecommunications were attempted between this nurse and patient, however failed, due to patient having technical difficulties OR patient did not have access to video capability.  We continued and completed visit with audio only.  Some vital signs may be absent or patient reported.   Karston Hyland E Devario Bucklew, LPN   Review of Systems     Cardiac Risk Factors include: advanced age (>46mn, >>63women);dyslipidemia;hypertension;obesity (BMI >30kg/m2);sedentary lifestyle     Objective:    Today's Vitals   09/28/21 1453  Weight: 167 lb (75.8 kg)   Body mass index is 32.61 kg/m.     09/28/2021    2:58 PM 09/27/2020    4:41 PM 09/03/2019    8:15 AM  Advanced Directives  Does Patient Have a Medical Advance Directive? Yes Yes Yes  Type of [place] will Living will  Does patient want to make changes to medical advance directive?   No - Patient declined  Copy of [place] Chart? No - copy requested No - copy requested   Would patient like information on creating a medical advance directive?   No - Patient declined    Current Medications (verified) Outpatient Encounter Medications as of 09/28/2021   Medication Sig   aspirin EC 81 MG tablet Take 81 mg by mouth daily.   atorvastatin (LIPITOR) 40 MG tablet Take 1 tablet (40 mg total) by mouth daily.   Cyanocobalamin (VITAMIN B 12 PO) Take by mouth.   hydrochlorothiazide (HYDRODIURIL) 25 MG tablet Take 1 tablet (25 mg total) by mouth daily.   linaclotide (LINZESS) 72 MCG capsule Take 1 capsule (72 mcg total) by mouth daily before breakfast.   loratadine (CLARITIN) 10 MG tablet Take 10 mg by mouth daily.   Lysine 500 MG TABS Take by mouth.   metoprolol succinate (TOPROL-XL) 25 MG 24 hr tablet Take 1 tablet (25 mg total) by mouth daily.   Multiple Vitamin (MULTIVITAMIN) tablet Take 1 tablet by mouth daily.   Omega-3 Fatty Acids (FISH OIL) 1000 MG CAPS Take by mouth.   No facility-administered encounter medications on file as of 09/28/2021.    Allergies (verified) Elemental sulfur   History: Past Medical History:  Diagnosis Date   Hyperlipidemia    Hypertension    Past Surgical History:  Procedure Laterality Date   ABDOMINAL HYSTERECTOMY  1991   Family History  Problem Relation Age of Onset   Hypertension Mother    Hyperlipidemia Father    Hypertension Father    Breast cancer Neg Hx    Social History   Socioeconomic History   Marital status: Widowed    Spouse name: Not on file   Number of children: 1   Years of education: Not on file   Highest education level: Not on file  Occupational History  Occupation: Cabin crew  Tobacco Use   Smoking status: Never   Smokeless tobacco: Never  Substance and Sexual Activity   Alcohol use: No   Drug use: No   Sexual activity: Never  Other Topics Concern   Not on file  Social History Narrative   Have one son who lives in [place]   She is still working full time - Cabin crew   Social Determinants of Radio broadcast assistant Strain: Low Risk    Difficulty of Paying Living Expenses: Not hard at all  Food Insecurity: No Food Insecurity   Worried About Charity fundraiser in the  Last Year: Never true   Arboriculturist in the Last Year: Never true  Transportation Needs: No Transportation Needs   Lack of Transportation (Medical): No   Lack of Transportation (Non-Medical): No  Physical Activity: Insufficiently Active   Days of Exercise per Week: 7 days   Minutes of Exercise per Session: 20 min  Stress: No Stress Concern Present   Feeling of Stress : Not at all  Social Connections: Moderately Integrated   Frequency of Communication with Friends and Family: More than three times a week   Frequency of Social Gatherings with Friends and Family: More than three times a week   Attends Religious Services: More than 4 times per year   Active Member of [employer] or Organizations: Yes   Attends Archivist Meetings: More than 4 times per year   Marital Status: Widowed    Tobacco Counseling Counseling given: Not Answered   Clinical Intake:  Pre-visit preparation completed: Yes  Pain : No/denies pain     BMI - recorded: 32.61 Nutritional Status: BMI > 30  Obese Nutritional Risks: None Diabetes: No  How often do you need to have someone help you when you read instructions, pamphlets, or other written materials from your doctor or pharmacy?: 1 - Never  Diabetic? no  Interpreter Needed?: No  Information entered by :: Remer Couse, LPN   Activities of Daily Living    09/28/2021    2:58 PM  In your present state of health, do you have any difficulty performing the following activities:  Hearing? 0  Vision? 0  Difficulty concentrating or making decisions? 0  Walking or climbing stairs? 0  Dressing or bathing? 0  Doing errands, shopping? 0  Preparing Food and eating ? N  Using the Toilet? N  In the past six months, have you accidently leaked urine? N  Do you have problems with loss of bowel control? N  Managing your Medications? N  Managing your Finances? N  Housekeeping or managing your Housekeeping? N    Patient Care Team: Dettinger, Fransisca Kaufmann, MD as PCP - General (Family Medicine) Harlen Labs, MD as Referring Physician (Optometry) Rozann Lesches, Justus Memory, MD as Referring Physician (Dermatology)  Indicate any recent Medical Services you may have received from other than [facility] providers in the past year (date may be approximate).     Assessment:   This is a routine wellness examination for [facility].  Hearing/Vision screen Hearing Screening - Comments:: Denies hearing difficulties   Vision Screening - Comments:: Wears rx glasses - up to date with routine eye exams with [facility]  Dietary issues and exercise activities discussed: Current Exercise Habits: The patient has a physically strenuous job, but has no regular exercise apart from work. (walks a lot for work), Exercise limited by: None identified   Goals Addressed  This Visit's Progress    Blood Pressure < 130/80       Exercise 3x per week (30 min per time)   On track      Depression Screen    09/28/2021    2:56 PM 08/30/2021    1:07 PM 03/01/2021    3:22 PM 09/27/2020    4:28 PM 03/03/2020    1:01 PM 09/03/2019    8:16 AM 09/02/2019    1:03 PM  PHQ 2/9 Scores  PHQ - 2 Score 0 0 0 0 0 0 0  PHQ- 9 Score 0 0 0        Fall Risk    09/28/2021    2:55 PM 08/30/2021    1:07 PM 03/01/2021    3:22 PM 09/27/2020    4:30 PM 03/03/2020    1:01 PM  [place] in the past year? 0 0 0 0 0  Number falls in past yr: 0   0   Injury with Fall? 0   0   Risk for fall due to : No Fall Risks      Follow up Falls prevention discussed        [place]:  Any stairs in or around the home? No  If so, are there any without handrails? No  Home free of loose throw rugs in walkways, pet beds, electrical cords, etc? Yes  Adequate lighting in your home to reduce risk of falls? Yes   ASSISTIVE DEVICES UTILIZED TO PREVENT FALLS:  Life alert? No  Use of a cane, walker or w/c? No  Grab bars in the bathroom? Yes   Shower chair or bench in shower? Yes  Elevated toilet seat or a handicapped toilet? Yes   TIMED UP AND GO:  Was the test performed? No . Telephonic visit  Cognitive Function:        09/28/2021    2:59 PM 09/03/2019    8:12 AM  6CIT Screen  What Year? 0 points 0 points  What month? 0 points 0 points  What time? 0 points 0 points  Count back from 20 0 points 0 points  Months in reverse 0 points 0 points  Repeat phrase 2 points 0 points  Total Score 2 points 0 points    Immunizations Immunization History  Administered Date(s) Administered   Fluad Quad(high Dose 65+) 02/17/2019, 03/03/2020   Influenza, High Dose Seasonal PF 02/25/2017, 02/24/2018   Influenza,inj,Quad PF,6+ Mos 02/11/2016   Moderna Sars-Covid-2 Vaccination 07/15/2019, 08/20/2019, 04/14/2020   Pneumococcal Conjugate-13 03/01/2014   Pneumococcal Polysaccharide-23 01/20/2019   Zoster Recombinat (Shingrix) 08/30/2021    TDAP status: Due, Education has been provided regarding the importance of this vaccine. Advised may receive this vaccine at local pharmacy or Health Dept. Aware to provide a copy of the vaccination record if obtained from local pharmacy or Health Dept. Verbalized acceptance and understanding.  Flu Vaccine status: Up to date  Pneumococcal vaccine status: Up to date  Covid-19 vaccine status: Completed vaccines  Qualifies for Shingles Vaccine? Yes   Zostavax completed Yes   Shingrix Completed?: No.    Education has been provided regarding the importance of this vaccine. Patient has been advised to call insurance company to determine out of pocket expense if they have not yet received this vaccine. Advised may also receive vaccine at local pharmacy or Health Dept. Verbalized acceptance and understanding.  Screening Tests Health Maintenance  Topic Date Due   TETANUS/TDAP  03/01/2022 (Originally 04/06/1965)   COVID-19 Vaccine (4 - Booster for Moderna series) 03/01/2022 (Originally 06/09/2020)    COLONOSCOPY (Pts 45-6yr Insurance coverage will need to be confirmed)  08/31/2022 (Originally 07/25/2021)   Zoster Vaccines- Shingrix (2 of 2) 10/25/2021   INFLUENZA VACCINE  12/11/2021   MAMMOGRAM  03/07/2022   DEXA SCAN  03/15/2023   Pneumonia Vaccine 76 Years old  Completed   Hepatitis C Screening  Completed   HPV VACCINES  Aged Out    Health Maintenance  There are no preventive care reminders to display for this patient.  Colorectal cancer screening: Type of screening: Colonoscopy. Completed 07/26/2011. Repeat every 10 years - Patient just returned Cologuard test last week.  Mammogram status: Completed 03/07/2021. Repeat every year  Bone Density status: Completed 03/14/2021. Results reflect: Bone density results: NORMAL. Repeat every 2 years.  Lung Cancer Screening: (Low Dose CT Chest recommended if Age 76-80years, 30 pack-year currently smoking OR have quit w/in 15years.) does not qualify.    Additional Screening:  Hepatitis C Screening: does qualify; Completed 03/03/2020  Vision Screening: Recommended annual ophthalmology exams for early detection of glaucoma and other disorders of the eye. Is the patient up to date with their annual eye exam?  Yes  Who is the provider or what is the name of the office in which the patient attends annual eye exams? [facility] [place] is not established with a provider, would they like to be referred to a provider to establish care? No .   Dental Screening: Recommended annual dental exams for proper oral hygiene  Community Resource Referral / Chronic Care Management: CRR required this visit?  No   CCM required this visit?  No      Plan:     I have personally reviewed and noted the following in the patient's chart:   Medical and social history Use of alcohol, tobacco or illicit drugs  Current medications and supplements including opioid prescriptions.  Functional ability and status Nutritional status Physical  activity Advanced directives List of other physicians Hospitalizations, surgeries, and ER visits in previous 12 months Vitals Screenings to include cognitive, depression, and falls Referrals and appointments  In addition, I have reviewed and discussed with patient certain preventive protocols, quality metrics, and best practice recommendations. A written personalized care plan for preventive services as well as general preventive health recommendations were provided to patient.     ASandrea Hammond LPN   [id number]  Nurse Notes: None

## 2021-09-28 NOTE — Patient Instructions (Signed)
Teresa Quinn , Thank you for taking time to come for your Medicare Wellness Visit. I appreciate your ongoing commitment to your health goals. Please review the following plan we discussed and let me know if I can assist you in the future.   Screening recommendations/referrals: Colonoscopy: Done 07/26/2011 - recently returned Cologuard - We will call you when we get the results Mammogram: Done 03/07/2021 - Repeat annually  Bone Density: Done 03/14/2021 - Repeat every 2 years  Recommended yearly ophthalmology/optometry visit for glaucoma screening and checkup Recommended yearly dental visit for hygiene and checkup  Vaccinations: Influenza vaccine: Done 2022 - Repeat annually  Pneumococcal vaccine: Done  03/01/2014 & 01/20/2019  Tdap vaccine: Due - recommended every 10 years Shingles vaccine: Done 08/30/2021 - due for second dose at next visit   Covid-19: Done 07/15/2019, 08/20/2019, & 04/14/2020  Advanced directives: Please bring a copy of your health care power of attorney and living will to the office to be added to your chart at your convenience.   Conditions/risks identified: Aim for 30 minutes of exercise or brisk walking, 6-8 glasses of water, and 5 servings of fruits and vegetables each day.   Next appointment: Follow up in one year for your annual wellness visit    Preventive Care 65 Years and Older, Female Preventive care refers to lifestyle choices and visits with your health care provider that can promote health and wellness. What does preventive care include? A yearly physical exam. This is also called an annual well check. Dental exams once or twice a year. Routine eye exams. Ask your health care provider how often you should have your eyes checked. Personal lifestyle choices, including: Daily care of your teeth and gums. Regular physical activity. Eating a healthy diet. Avoiding tobacco and drug use. Limiting alcohol use. Practicing safe sex. Taking low-dose aspirin every  day. Taking vitamin and mineral supplements as recommended by your health care provider. What happens during an annual well check? The services and screenings done by your health care provider during your annual well check will depend on your age, overall health, lifestyle risk factors, and family history of disease. Counseling  Your health care provider may ask you questions about your: Alcohol use. Tobacco use. Drug use. Emotional well-being. Home and relationship well-being. Sexual activity. Eating habits. History of falls. Memory and ability to understand (cognition). Work and work Statistician. Reproductive health. Screening  You may have the following tests or measurements: Height, weight, and BMI. Blood pressure. Lipid and cholesterol levels. These may be checked every 5 years, or more frequently if you are over 48 years old. Skin check. Lung cancer screening. You may have this screening every year starting at age 76 if you have a 30-pack-year history of smoking and currently smoke or have quit within the past 15 years. Fecal occult blood test (FOBT) of the stool. You may have this test every year starting at age 76. Flexible sigmoidoscopy or colonoscopy. You may have a sigmoidoscopy every 5 years or a colonoscopy every 10 years starting at age 76. Hepatitis C blood test. Hepatitis B blood test. Sexually transmitted disease (STD) testing. Diabetes screening. This is done by checking your blood sugar (glucose) after you have not eaten for a while (fasting). You may have this done every 1-3 years. Bone density scan. This is done to screen for osteoporosis. You may have this done starting at age 76. Mammogram. This may be done every 1-2 years. Talk to your health care provider about how often you  should have regular mammograms. Talk with your health care provider about your test results, treatment options, and if necessary, the need for more tests. Vaccines  Your health care  provider may recommend certain vaccines, such as: Influenza vaccine. This is recommended every year. Tetanus, diphtheria, and acellular pertussis (Tdap, Td) vaccine. You may need a Td booster every 10 years. Zoster vaccine. You may need this after age 76. Pneumococcal 13-valent conjugate (PCV13) vaccine. One dose is recommended after age 76. Pneumococcal polysaccharide (PPSV23) vaccine. One dose is recommended after age 65. Talk to your health care provider about which screenings and vaccines you need and how often you need them. This information is not intended to replace advice given to you by your health care provider. Make sure you discuss any questions you have with your health care provider. Document Released: 05/26/2015 Document Revised: 01/17/2016 Document Reviewed: 02/28/2015 Elsevier Interactive Patient Education  2017 [place] Prevention in the Home Falls can cause injuries. They can happen to people of all ages. There are many things you can do to make your home safe and to help prevent falls. What can I do on the outside of my home? Regularly fix the edges of walkways and driveways and fix any cracks. Remove anything that might make you trip as you walk through a door, such as a raised step or threshold. Trim any bushes or trees on the path to your home. Use bright outdoor lighting. Clear any walking paths of anything that might make someone trip, such as rocks or tools. Regularly check to see if handrails are loose or broken. Make sure that both sides of any steps have handrails. Any raised decks and porches should have guardrails on the edges. Have any leaves, snow, or ice cleared regularly. Use sand or salt on walking paths during winter. Clean up any spills in your garage right away. This includes oil or grease spills. What can I do in the bathroom? Use night lights. Install grab bars by the toilet and in the tub and shower. Do not use towel bars as grab  bars. Use non-skid mats or decals in the tub or shower. If you need to sit down in the shower, use a plastic, non-slip stool. Keep the floor dry. Clean up any water that spills on the floor as soon as it happens. Remove soap buildup in the tub or shower regularly. Attach bath mats securely with double-sided non-slip rug tape. Do not have throw rugs and other things on the floor that can make you trip. What can I do in the bedroom? Use night lights. Make sure that you have a light by your bed that is easy to reach. Do not use any sheets or blankets that are too big for your bed. They should not hang down onto the floor. Have a firm chair that has side arms. You can use this for support while you get dressed. Do not have throw rugs and other things on the floor that can make you trip. What can I do in the kitchen? Clean up any spills right away. Avoid walking on wet floors. Keep items that you use a lot in easy-to-reach places. If you need to reach something above you, use a strong step stool that has a grab bar. Keep electrical cords out of the way. Do not use floor polish or wax that makes floors slippery. If you must use wax, use non-skid floor wax. Do not have throw rugs and other things on the  floor that can make you trip. What can I do with my stairs? Do not leave any items on the stairs. Make sure that there are handrails on both sides of the stairs and use them. Fix handrails that are broken or loose. Make sure that handrails are as long as the stairways. Check any carpeting to make sure that it is firmly attached to the stairs. Fix any carpet that is loose or worn. Avoid having throw rugs at the top or bottom of the stairs. If you do have throw rugs, attach them to the floor with carpet tape. Make sure that you have a light switch at the top of the stairs and the bottom of the stairs. If you do not have them, ask someone to add them for you. What else can I do to help prevent  falls? Wear shoes that: Do not have high heels. Have rubber bottoms. Are comfortable and fit you well. Are closed at the toe. Do not wear sandals. If you use a stepladder: Make sure that it is fully opened. Do not climb a closed stepladder. Make sure that both sides of the stepladder are locked into place. Ask someone to hold it for you, if possible. Clearly mark and make sure that you can see: Any grab bars or handrails. First and last steps. Where the edge of each step is. Use tools that help you move around (mobility aids) if they are needed. These include: Canes. Walkers. Scooters. Crutches. Turn on the lights when you go into a dark area. Replace any light bulbs as soon as they burn out. Set up your furniture so you have a clear path. Avoid moving your furniture around. If any of your floors are uneven, fix them. If there are any pets around you, be aware of where they are. Review your medicines with your doctor. Some medicines can make you feel dizzy. This can increase your chance of falling. Ask your doctor what other things that you can do to help prevent falls. This information is not intended to replace advice given to you by your health care provider. Make sure you discuss any questions you have with your health care provider. Document Released: 02/23/2009 Document Revised: 10/05/2015 Document Reviewed: 06/03/2014 Elsevier Interactive Patient Education  2017 [employer].

## 2021-10-05 LAB — COLOGUARD: COLOGUARD: POSITIVE — AB

## 2021-10-10 DIAGNOSIS — M5137 Other intervertebral disc degeneration, lumbosacral region: Secondary | ICD-10-CM | POA: Diagnosis not present

## 2021-10-10 DIAGNOSIS — M9904 Segmental and somatic dysfunction of sacral region: Secondary | ICD-10-CM | POA: Diagnosis not present

## 2021-10-10 DIAGNOSIS — M9905 Segmental and somatic dysfunction of pelvic region: Secondary | ICD-10-CM | POA: Diagnosis not present

## 2021-10-10 DIAGNOSIS — M9903 Segmental and somatic dysfunction of lumbar region: Secondary | ICD-10-CM | POA: Diagnosis not present

## 2021-10-10 NOTE — Addendum Note (Signed)
Addended by: Nigel Berthold C on: 10/10/2021 02:40 PM   Modules accepted: Orders

## 2021-10-17 ENCOUNTER — Encounter: Payer: Self-pay | Admitting: Internal Medicine

## 2021-10-27 ENCOUNTER — Encounter (HOSPITAL_COMMUNITY): Payer: Self-pay

## 2021-10-27 ENCOUNTER — Other Ambulatory Visit: Payer: Self-pay

## 2021-10-27 ENCOUNTER — Emergency Department (HOSPITAL_COMMUNITY): Payer: Medicare Other

## 2021-10-27 ENCOUNTER — Emergency Department (HOSPITAL_COMMUNITY)
Admission: EM | Admit: 2021-10-27 | Discharge: 2021-10-27 | Disposition: A | Discharge status: Home / Self Care | Payer: Medicare Other | Attending: Emergency Medicine | Admitting: Emergency Medicine

## 2021-10-27 DIAGNOSIS — S3991XA Unspecified injury of abdomen, initial encounter: Secondary | ICD-10-CM | POA: Diagnosis not present

## 2021-10-27 DIAGNOSIS — I10 Essential (primary) hypertension: Secondary | ICD-10-CM | POA: Diagnosis not present

## 2021-10-27 DIAGNOSIS — M2578 Osteophyte, vertebrae: Secondary | ICD-10-CM | POA: Diagnosis not present

## 2021-10-27 DIAGNOSIS — M47812 Spondylosis without myelopathy or radiculopathy, cervical region: Secondary | ICD-10-CM | POA: Diagnosis not present

## 2021-10-27 DIAGNOSIS — S3992XA Unspecified injury of lower back, initial encounter: Secondary | ICD-10-CM | POA: Diagnosis not present

## 2021-10-27 DIAGNOSIS — S8991XA Unspecified injury of right lower leg, initial encounter: Secondary | ICD-10-CM | POA: Diagnosis not present

## 2021-10-27 DIAGNOSIS — M19011 Primary osteoarthritis, right shoulder: Secondary | ICD-10-CM | POA: Diagnosis not present

## 2021-10-27 DIAGNOSIS — S2222XA Fracture of body of sternum, initial encounter for closed fracture: Secondary | ICD-10-CM | POA: Diagnosis not present

## 2021-10-27 DIAGNOSIS — Y92410 Unspecified street and highway as the place of occurrence of the external cause: Secondary | ICD-10-CM | POA: Diagnosis not present

## 2021-10-27 DIAGNOSIS — M48061 Spinal stenosis, lumbar region without neurogenic claudication: Secondary | ICD-10-CM | POA: Diagnosis not present

## 2021-10-27 DIAGNOSIS — R109 Unspecified abdominal pain: Secondary | ICD-10-CM | POA: Diagnosis not present

## 2021-10-27 DIAGNOSIS — Z79899 Other long term (current) drug therapy: Secondary | ICD-10-CM | POA: Insufficient documentation

## 2021-10-27 DIAGNOSIS — I2510 Atherosclerotic heart disease of native coronary artery without angina pectoris: Secondary | ICD-10-CM | POA: Diagnosis not present

## 2021-10-27 DIAGNOSIS — K7689 Other specified diseases of liver: Secondary | ICD-10-CM | POA: Diagnosis not present

## 2021-10-27 DIAGNOSIS — M79661 Pain in right lower leg: Secondary | ICD-10-CM | POA: Diagnosis not present

## 2021-10-27 DIAGNOSIS — R42 Dizziness and giddiness: Secondary | ICD-10-CM | POA: Insufficient documentation

## 2021-10-27 DIAGNOSIS — K449 Diaphragmatic hernia without obstruction or gangrene: Secondary | ICD-10-CM | POA: Diagnosis not present

## 2021-10-27 DIAGNOSIS — R188 Other ascites: Secondary | ICD-10-CM | POA: Diagnosis not present

## 2021-10-27 DIAGNOSIS — S199XXA Unspecified injury of neck, initial encounter: Secondary | ICD-10-CM | POA: Diagnosis not present

## 2021-10-27 DIAGNOSIS — S301XXA Contusion of abdominal wall, initial encounter: Secondary | ICD-10-CM | POA: Diagnosis not present

## 2021-10-27 DIAGNOSIS — M4312 Spondylolisthesis, cervical region: Secondary | ICD-10-CM | POA: Diagnosis not present

## 2021-10-27 DIAGNOSIS — S0990XA Unspecified injury of head, initial encounter: Secondary | ICD-10-CM | POA: Diagnosis not present

## 2021-10-27 DIAGNOSIS — M47814 Spondylosis without myelopathy or radiculopathy, thoracic region: Secondary | ICD-10-CM | POA: Diagnosis not present

## 2021-10-27 DIAGNOSIS — Z7982 Long term (current) use of aspirin: Secondary | ICD-10-CM | POA: Diagnosis not present

## 2021-10-27 DIAGNOSIS — R0789 Other chest pain: Secondary | ICD-10-CM | POA: Insufficient documentation

## 2021-10-27 DIAGNOSIS — I6523 Occlusion and stenosis of bilateral carotid arteries: Secondary | ICD-10-CM | POA: Diagnosis not present

## 2021-10-27 LAB — COMPREHENSIVE METABOLIC PANEL
ALT: 27 U/L (ref 0–44)
AST: 47 U/L — ABNORMAL HIGH (ref 15–41)
Albumin: 4.3 g/dL (ref 3.5–5.0)
Alkaline Phosphatase: 75 U/L (ref 38–126)
Anion gap: 11 (ref 5–15)
BUN: 16 mg/dL (ref 8–23)
CO2: 24 mmol/L (ref 22–32)
Calcium: 9.4 mg/dL (ref 8.9–10.3)
Chloride: 97 mmol/L — ABNORMAL LOW (ref 98–111)
Creatinine, Ser: 0.8 mg/dL (ref 0.44–1.00)
GFR, Estimated: >60 mL/min (ref >60)
Glucose, Bld: 100 mg/dL — ABNORMAL HIGH (ref 70–99)
Potassium: 3.6 mmol/L (ref 3.5–5.1)
Sodium: 132 mmol/L — ABNORMAL LOW (ref 135–145)
Total Bilirubin: 0.7 mg/dL (ref 0.3–1.2)
Total Protein: 7.4 g/dL (ref 6.5–8.1)

## 2021-10-27 LAB — CBC WITH DIFFERENTIAL/PLATELET
Abs Immature Granulocytes: 0.1 10*3/uL — ABNORMAL HIGH (ref 0.00–0.07)
Basophils Absolute: 0.1 10*3/uL (ref 0.0–0.1)
Basophils Relative: 0 %
Eosinophils Absolute: 0 10*3/uL (ref 0.0–0.5)
Eosinophils Relative: 0 %
HCT: 36.7 % (ref 36.0–46.0)
Hemoglobin: 12.6 g/dL (ref 12.0–15.0)
Immature Granulocytes: 1 %
Lymphocytes Relative: 3 %
Lymphs Abs: 0.8 10*3/uL (ref 0.7–4.0)
MCH: 31.8 pg (ref 26.0–34.0)
MCHC: 34.3 g/dL (ref 30.0–36.0)
MCV: 92.7 fL (ref 80.0–100.0)
Monocytes Absolute: 1.2 10*3/uL — ABNORMAL HIGH (ref 0.1–1.0)
Monocytes Relative: 6 %
Neutro Abs: 19.7 10*3/uL — ABNORMAL HIGH (ref 1.7–7.7)
Neutrophils Relative %: 90 %
Platelets: 280 10*3/uL (ref 150–400)
RBC: 3.96 MIL/uL (ref 3.87–5.11)
RDW: 12.5 % (ref 11.5–15.5)
WBC: 21.8 10*3/uL — ABNORMAL HIGH (ref 4.0–10.5)
nRBC: 0 % (ref 0.0–0.2)

## 2021-10-27 MED ORDER — ONDANSETRON 4 MG PO TBDP
4.0000 mg | ORAL_TABLET | Freq: Once | ORAL | Status: AC
  Administered 2021-10-27: 4 mg via ORAL
  Filled 2021-10-27: qty 1

## 2021-10-27 MED ORDER — IOHEXOL 300 MG/ML  SOLN
100.0000 mL | Freq: Once | INTRAMUSCULAR | Status: AC | PRN
  Administered 2021-10-27: 100 mL via INTRAVENOUS

## 2021-10-27 MED ORDER — SODIUM CHLORIDE 0.9 % IV BOLUS
250.0000 mL | Freq: Once | INTRAVENOUS | Status: AC
  Administered 2021-10-27: 250 mL via INTRAVENOUS
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
## 2021-10-27 NOTE — Discharge Instructions (Signed)
Take Tylenol or Motrin for pain and follow-up with your doctor next week
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
## 2021-11-02 ENCOUNTER — Ambulatory Visit (INDEPENDENT_AMBULATORY_CARE_PROVIDER_SITE_OTHER): Payer: Medicare Other | Admitting: Nurse Practitioner

## 2021-11-02 ENCOUNTER — Encounter: Payer: Self-pay | Admitting: Nurse Practitioner

## 2021-11-02 VITALS — BP 137/87 | HR 100 | Ht 65.0 in | Wt 163.0 lb

## 2021-11-02 DIAGNOSIS — T148XXA Other injury of unspecified body region, initial encounter: Secondary | ICD-10-CM

## 2021-11-02 DIAGNOSIS — S80921A Unspecified superficial injury of right lower leg, initial encounter: Secondary | ICD-10-CM

## 2021-11-07 ENCOUNTER — Ambulatory Visit: Payer: Medicare Other | Admitting: Internal Medicine

## 2021-11-08 ENCOUNTER — Ambulatory Visit: Payer: Medicare Other | Admitting: Internal Medicine

## 2021-11-12 NOTE — Progress Notes (Unsigned)
Referring Provider: Dettinger, Fransisca Kaufmann, MD Primary Care Physician:  Dettinger, Fransisca Kaufmann, MD Primary Gastroenterologist:  Dr. Gala Romney  Chief Complaint  Patient presents with   positive cologuard     HPI:   Teresa Quinn is a 76 y.o. female presenting today at the request of Dettinger, Fransisca Kaufmann, MD for positive cologuard completed in May 2023. Hgb wnl.   Today: Last colonoscopy about 10 years ago in [place] with Dr. Gala Romney. No polyps. Reports poor prep in the past.   Denies rectal bleeding. Has chronic history of constipation. Taking Linzess 72 mcg 3 times a week. Has "regular" Bms on those days. No BM when she doesn't take Linzess. No abdominal pain or unintentional weight loss. No regular heartburn. Pepcid is as needed when eating something spicy. No N/V, or dysphagia.   No Fhx of colon cancer.   Past Medical History:  Diagnosis Date   Hyperlipidemia    Hypertension     Past Surgical History:  Procedure Laterality Date   ABDOMINAL HYSTERECTOMY  1991    Current Outpatient Medications  Medication Sig Dispense Refill   aspirin EC 81 MG tablet Take 81 mg by mouth daily.     atorvastatin (LIPITOR) 40 MG tablet Take 1 tablet (40 mg total) by mouth daily. 90 tablet 3   Cyanocobalamin (VITAMIN B 12 PO) Take by mouth.     famotidine (PEPCID) 20 MG tablet Take 20 mg by mouth daily as needed.     hydrochlorothiazide (HYDRODIURIL) 25 MG tablet Take 1 tablet (25 mg total) by mouth daily. 90 tablet 3   linaclotide (LINZESS) 72 MCG capsule Take 1 capsule (72 mcg total) by mouth daily before breakfast. 90 capsule 3   loratadine (CLARITIN) 10 MG tablet Take 10 mg by mouth daily.     Lysine 500 MG TABS Take by mouth.     metoprolol succinate (TOPROL-XL) 25 MG 24 hr tablet Take 1 tablet (25 mg total) by mouth daily. 90 tablet 3   Multiple Vitamin (MULTIVITAMIN) tablet Take 1 tablet by mouth daily.     Omega-3 Fatty Acids (FISH OIL) 1000 MG CAPS Take by mouth.     No current  facility-administered medications for this visit.    Allergies as of 11/14/2021 - Review Complete 11/14/2021  Allergen Reaction Noted   Elemental sulfur Rash 07/26/2016    Family History  Problem Relation Age of Onset   Hypertension Mother    Hyperlipidemia Father    Hypertension Father    Breast cancer Neg Hx    Cancer - Colon Neg Hx     Social History   Socioeconomic History   Marital status: Widowed    Spouse name: Not on file   Number of children: 1   Years of education: Not on file   Highest education level: Not on file  Occupational History   Occupation: Realtor  Tobacco Use   Smoking status: Never   Smokeless tobacco: Never  Substance and Sexual Activity   Alcohol use: No   Drug use: No   Sexual activity: Never  Other Topics Concern   Not on file  Social History Narrative   Have one son who lives in [place]   She is still working full time - Cabin crew   Social Determinants of Health   Financial Resource Strain: Low Risk  (09/28/2021)   Overall Financial Resource Strain (CARDIA)    Difficulty of Paying Living Expenses: Not hard at all  Food Insecurity: No [place] (09/28/2021)  Hunger Vital Sign    Worried About Running Out of Food in the Last Year: Never true    Ran Out of Food in the Last Year: Never true  Transportation Needs: No Transportation Needs (09/28/2021)   PRAPARE - Hydrologist (Medical): No    Lack of Transportation (Non-Medical): No  Physical Activity: Insufficiently Active (09/28/2021)   Exercise Vital Sign    Days of Exercise per Week: 7 days    Minutes of Exercise per Session: 20 min  Stress: No Stress Concern Present (09/28/2021)   [place]    Feeling of Stress : Not at all  Social Connections: Moderately Integrated (09/28/2021)   Social Connection and Isolation Panel [NHANES]    Frequency of Communication with Friends and Family: More than  three times a week    Frequency of Social Gatherings with Friends and Family: More than three times a week    Attends Religious Services: More than 4 times per year    Active Member of [employer] or Organizations: Yes    Attends Archivist Meetings: More than 4 times per year    Marital Status: Widowed  Intimate Partner Violence: Not At Risk (09/28/2021)   Humiliation, Afraid, Rape, and Kick questionnaire    Fear of Current or Ex-Partner: No    Emotionally Abused: No    Physically Abused: No    Sexually Abused: No    Review of Systems: Gen: Denies any fever, chills, cold or flu like symptoms, pre-syncope, or syncope.  CV: Denies chest pain, heart palpitations. Resp: Denies shortness of breath, cough.  GI: See HPI GU : Denies urinary burning, urinary frequency, urinary hesitancy MS: Denies joint pain. Derm: Denies rash. Psych: Denies depression, anxiety. Heme: See HPI  Physical Exam: BP (!) 156/92   Pulse (!) 101   Temp 98.4 F (36.9 C) (Temporal)   Ht '5\' 5"'$  (1.651 m)   Wt 158 lb 9.6 oz (71.9 kg)   BMI 26.39 kg/m  General:   Alert and oriented. Pleasant and cooperative. Well-nourished and well-developed.  Head:  Normocephalic and atraumatic. Eyes:  Without icterus, sclera clear and conjunctiva pink.  Ears:  Normal auditory acuity. Lungs:  Clear to auscultation bilaterally. No wheezes, rales, or rhonchi. No distress.  Heart:  S1, S2 present without murmurs appreciated.  Abdomen:  +BS, soft, non-tender and non-distended. No HSM noted. No guarding or rebound. No masses appreciated.  Rectal:  Deferred  Msk:  Symmetrical without gross deformities. Normal posture. Extremities:  Without edema. Neurologic:  Alert and  oriented x4;  grossly normal neurologically. Skin:  Intact without significant lesions or rashes. Psych: Normal mood and affect.    Assessment:  76 year old female with history of hypertension, hyperlipidemia, presenting today at the request of Dr.  Warrick Parisian for consult colonoscopy due to positive Cologuard completed in May 2023.  Clinically, patient is doing well.  She does have chronic history of constipation, currently taking Linzess 72 mcg 3 days a week with "regular" bowel movements on those days.  She has no alarm symptoms. Reviewed recent labs.  Hemoglobin within normal limits in April 2023.  Patient reports her last colonoscopy was about 10 years ago in [place] without polyps. No Fhx of colon cancer.   Etiology of positive Cologuard is not clear.  Could be false positive, secondary to occult bleeding, colon polyps, and unable to rule out malignancy.  We will proceed with a colonoscopy for further evaluation.  Of note, patient reports poor prep previously.  Regarding her chronic constipation, recommended taking Linzess 72 mcg every day as her symptoms are not adequately managed.    Plan:  Proceed with colonoscopy with propofol by Dr. Gala Romney in near future. The risks, benefits, and alternatives have been discussed with the patient in detail. The patient states understanding and desires to proceed. ASA 2 BMP at preop Extra 1/2-day of clear liquids and extra one half bowel prep due to chronic constipation and patient reporting poor colon prep previously. Take Linzess 72 mcg every day 30 minutes before first meal.  She will notify us if she is not having adequate bowel movements with this regimen. Request colonoscopy records from [facility].  Follow-up as needed.   Aliene Altes, PA-C [facility] 11/14/2021

## 2021-11-14 ENCOUNTER — Ambulatory Visit (INDEPENDENT_AMBULATORY_CARE_PROVIDER_SITE_OTHER): Payer: Medicare Other | Admitting: Gastroenterology

## 2021-11-14 ENCOUNTER — Encounter: Payer: Self-pay | Admitting: Gastroenterology

## 2021-11-14 VITALS — BP 156/92 | HR 101 | Temp 98.4°F | Ht 65.0 in | Wt 158.6 lb

## 2021-11-14 DIAGNOSIS — K5909 Other constipation: Secondary | ICD-10-CM | POA: Diagnosis not present

## 2021-11-14 DIAGNOSIS — R195 Other fecal abnormalities: Secondary | ICD-10-CM | POA: Diagnosis not present

## 2021-11-14 NOTE — Patient Instructions (Signed)
We will arrange for you to have a colonoscopy in the near future with Dr. Gala Romney.  Please start taking your Linzess 72 mcg every day 30 minutes before your first meal to ensure you are having adequate bowel movements daily.  As we discussed, this is important to ensure that you have a good colon prep.  We will follow-up with you as needed after your colonoscopy.  Do not hesitate to call if you have any new GI concerns.  It was very nice meeting you today!  Aliene Altes, PA-C [facility]

## 2021-11-15 ENCOUNTER — Telehealth: Payer: Self-pay | Admitting: *Deleted

## 2021-11-15 DIAGNOSIS — R195 Other fecal abnormalities: Secondary | ICD-10-CM

## 2021-11-15 MED ORDER — PEG 3350-KCL-NA BICARB-NACL 420 G PO SOLR: ORAL | 0 refills | Status: DC

## 2021-11-15 NOTE — Telephone Encounter (Signed)
Called pt. She has been scheduled for TCS with Dr. Gala Romney, ASA 2 on 8/7 at 8:15am. Aware will need 2 prep kits. Rx sent to pharmacy. Instructions mailed. Aware needs lab prior.   PA approved via [facility]. Auth# [id number], DOS: Dec 17, 2021 - Mar 17, 2022

## 2021-11-21 ENCOUNTER — Telehealth: Payer: Self-pay | Admitting: Gastroenterology

## 2021-11-21 NOTE — Telephone Encounter (Signed)
Received and reviewed colonoscopy report from [facility] dated 07/26/2011.    Endoscopy completed with Dr. Britta Mccreedy.  She was found to have a pseudomembrane in the transverse, descending, and sigmoid colon s/p biopsied.  Otherwise, no abnormalities.  Pathology with focal active mucosal colitis, possibly secondary to early pseudomembranous colitis or resolving pseudomembranous colitis.  Other causes including NSAID related mucosal injury and early acute self-limited infectious colitis could be considered.   I have no additional recommendations at this time. Requested records for our database. We will proceed with colonoscopy as planned.

## 2021-11-26 ENCOUNTER — Telehealth: Payer: Self-pay | Admitting: *Deleted
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
## 2021-11-26 NOTE — Telephone Encounter (Signed)
Noted  

## 2021-12-17 ENCOUNTER — Ambulatory Visit (HOSPITAL_COMMUNITY): Admit: 2021-12-17 | Payer: Medicare Other | Admitting: Internal Medicine

## 2021-12-17 ENCOUNTER — Encounter (HOSPITAL_COMMUNITY): Payer: Self-pay

## 2021-12-17 SURGERY — COLONOSCOPY WITH PROPOFOL
Anesthesia: Monitor Anesthesia Care

## 2022-01-30 DIAGNOSIS — M9904 Segmental and somatic dysfunction of sacral region: Secondary | ICD-10-CM | POA: Diagnosis not present

## 2022-01-30 DIAGNOSIS — M9905 Segmental and somatic dysfunction of pelvic region: Secondary | ICD-10-CM | POA: Diagnosis not present

## 2022-01-30 DIAGNOSIS — M5137 Other intervertebral disc degeneration, lumbosacral region: Secondary | ICD-10-CM | POA: Diagnosis not present

## 2022-01-30 DIAGNOSIS — M9903 Segmental and somatic dysfunction of lumbar region: Secondary | ICD-10-CM | POA: Diagnosis not present

## 2022-02-27 DIAGNOSIS — M9904 Segmental and somatic dysfunction of sacral region: Secondary | ICD-10-CM | POA: Diagnosis not present

## 2022-02-27 DIAGNOSIS — M5137 Other intervertebral disc degeneration, lumbosacral region: Secondary | ICD-10-CM | POA: Diagnosis not present

## 2022-02-27 DIAGNOSIS — M9905 Segmental and somatic dysfunction of pelvic region: Secondary | ICD-10-CM | POA: Diagnosis not present

## 2022-02-27 DIAGNOSIS — M9903 Segmental and somatic dysfunction of lumbar region: Secondary | ICD-10-CM | POA: Diagnosis not present

## 2022-03-01 ENCOUNTER — Ambulatory Visit (INDEPENDENT_AMBULATORY_CARE_PROVIDER_SITE_OTHER): Payer: Medicare Other | Admitting: Family Medicine

## 2022-03-01 ENCOUNTER — Encounter: Payer: Self-pay | Admitting: Family Medicine

## 2022-03-01 VITALS — BP 138/74 | HR 72 | Temp 97.1°F | Ht 65.0 in | Wt 151.0 lb

## 2022-03-01 DIAGNOSIS — I10 Essential (primary) hypertension: Secondary | ICD-10-CM | POA: Diagnosis not present

## 2022-03-01 DIAGNOSIS — Z23 Encounter for immunization: Secondary | ICD-10-CM

## 2022-03-01 DIAGNOSIS — E782 Mixed hyperlipidemia: Secondary | ICD-10-CM

## 2022-03-01 DIAGNOSIS — K589 Irritable bowel syndrome without diarrhea: Secondary | ICD-10-CM | POA: Diagnosis not present

## 2022-03-01 NOTE — Progress Notes (Unsigned)
BP 138/74   Pulse 72   Temp (!) 97.1 F (36.2 C)   Ht 5' 5"  (1.651 m)   Wt 151 lb (68.5 kg)   SpO2 99%   BMI 25.13 kg/m    Subjective:   Patient ID: Teresa Quinn, female    DOB: [birth year], 76 y.o.   MRN: [id number]  HPI: Teresa Quinn is a 76 y.o. female presenting on 03/01/2022 for Medical Management of Chronic Issues, Hyperlipidemia, and Hypertension   HPI Hypertension Patient is currently on metoprolol and hydrochlorothiazide, and their blood pressure today is 138/74. Patient denies any lightheadedness or dizziness. Patient denies headaches, blurred vision, chest pains, shortness of breath, or weakness. Denies any side effects from medication and is content with current medication.   Hyperlipidemia Patient is coming in for recheck of his hyperlipidemia. The patient is currently taking atorvastatin. They deny any issues with myalgias or history of liver damage from it. They deny any focal numbness or weakness or chest pain.   IBS recheck Patient is coming in for IBS recheck and currently takes Linzess.  She feels like it is helping her a lot and her IBS is under control and she is very satisfied with the medicine.  Relevant past medical, surgical, family and social history reviewed and updated as indicated. Interim medical history since our last visit reviewed. Allergies and medications reviewed and updated.  Review of Systems  Constitutional:  Negative for chills and fever.  Eyes:  Negative for redness and visual disturbance.  Respiratory:  Negative for chest tightness and shortness of breath.   Cardiovascular:  Negative for chest pain and leg swelling.  Gastrointestinal:  Negative for abdominal pain, constipation and diarrhea.  Genitourinary:  Negative for difficulty urinating and dysuria.  Musculoskeletal:  Negative for back pain and gait problem.  Skin:  Negative for rash.  Neurological:  Negative for light-headedness and headaches.  Psychiatric/Behavioral:   Negative for agitation and behavioral problems.   All other systems reviewed and are negative.   Per HPI unless specifically indicated above   Allergies as of 03/01/2022       Reactions   Elemental Sulfur Rash        Medication List        Accurate as of March 01, 2022 11:59 PM. If you have any questions, ask your nurse or doctor.          aspirin EC 81 MG tablet Take 81 mg by mouth daily.   atorvastatin 40 MG tablet Commonly known as: LIPITOR Take 1 tablet (40 mg total) by mouth daily.   famotidine 20 MG tablet Commonly known as: PEPCID Take 20 mg by mouth daily as needed.   Fish Oil 1000 MG Caps Take by mouth.   hydrochlorothiazide 25 MG tablet Commonly known as: HYDRODIURIL Take 1 tablet (25 mg total) by mouth daily.   linaclotide 72 MCG capsule Commonly known as: Linzess Take 1 capsule (72 mcg total) by mouth daily before breakfast.   loratadine 10 MG tablet Commonly known as: CLARITIN Take 10 mg by mouth daily.   Lysine 500 MG Tabs Take by mouth.   metoprolol succinate 25 MG 24 hr tablet Commonly known as: TOPROL-XL Take 1 tablet (25 mg total) by mouth daily.   multivitamin tablet Take 1 tablet by mouth daily.   polyethylene glycol-electrolytes 420 g solution Commonly known as: NuLYTELY As directed   VITAMIN B 12 PO Take by mouth.         Objective:  BP 138/74   Pulse 72   Temp (!) 97.1 F (36.2 C)   Ht 5' 5"  (1.651 m)   Wt 151 lb (68.5 kg)   SpO2 99%   BMI 25.13 kg/m   Wt Readings from Last 3 Encounters:  03/01/22 151 lb (68.5 kg)  11/14/21 158 lb 9.6 oz (71.9 kg)  11/02/21 163 lb (73.9 kg)    Physical Exam Vitals and nursing note reviewed.  Constitutional:      General: She is not in acute distress.    Appearance: She is well-developed. She is not diaphoretic.  Eyes:     Conjunctiva/sclera: Conjunctivae normal.  Cardiovascular:     Rate and Rhythm: Normal rate and regular rhythm.     Heart sounds: Normal  heart sounds. No murmur heard. Pulmonary:     Effort: Pulmonary effort is normal. No respiratory distress.     Breath sounds: Normal breath sounds. No wheezing.  Musculoskeletal:        General: No tenderness. Normal range of motion.  Skin:    General: Skin is warm and dry.     Findings: No rash.  Neurological:     Mental Status: She is alert and oriented to person, place, and time.     Coordination: Coordination normal.  Psychiatric:        Behavior: Behavior normal.       Assessment & Plan:   Problem List Items Addressed This Visit       Cardiovascular and Mediastinum   Essential hypertension - Primary   Relevant Orders   CBC with Differential/Platelet (Completed)   CMP14+EGFR (Completed)     Digestive   Irritable bowel syndrome   Relevant Orders   CBC with Differential/Platelet (Completed)     Other   Hyperlipidemia   Relevant Orders   Lipid panel (Completed)   Other Visit Diagnoses     Need for shingles vaccine       Relevant Orders   Zoster Recombinant (Shingrix ) (Completed)       Continue current medicine, no changes. Follow up plan: Return in about 6 months (around 08/31/2022), or if symptoms worsen or fail to improve, for Hypertension and cholesterol.  Counseling provided for all of the vaccine components Orders Placed This Encounter  Procedures   Zoster Recombinant (Shingrix )   CBC with Differential/Platelet   CMP14+EGFR   Lipid panel    Caryl Pina, MD [facility] 03/04/2022, 8:01 AM

## 2022-03-02 LAB — CMP14+EGFR
ALT: 26 IU/L (ref 0–32)
AST: 38 IU/L (ref 0–40)
Albumin/Globulin Ratio: 2.2 (ref 1.2–2.2)
Albumin: 5 g/dL — ABNORMAL HIGH (ref 3.8–4.8)
Alkaline Phosphatase: 97 IU/L (ref 44–121)
BUN/Creatinine Ratio: 22 (ref 12–28)
BUN: 18 mg/dL (ref 8–27)
Bilirubin Total: 0.3 mg/dL (ref 0.0–1.2)
CO2: 25 mmol/L (ref 20–29)
Calcium: 10.3 mg/dL (ref 8.7–10.3)
Chloride: 97 mmol/L (ref 96–106)
Creatinine, Ser: 0.82 mg/dL (ref 0.57–1.00)
Globulin, Total: 2.3 g/dL (ref 1.5–4.5)
Glucose: 83 mg/dL (ref 70–99)
Potassium: 4.9 mmol/L (ref 3.5–5.2)
Sodium: 138 mmol/L (ref 134–144)
Total Protein: 7.3 g/dL (ref 6.0–8.5)
eGFR: 75 mL/min/{1.73_m2} (ref >59)

## 2022-03-02 LAB — CBC WITH DIFFERENTIAL/PLATELET
Basophils Absolute: 0.1 10*3/uL (ref 0.0–0.2)
Basos: 1 %
EOS (ABSOLUTE): 0.1 10*3/uL (ref 0.0–0.4)
Eos: 2 %
Hematocrit: 42.4 % (ref 34.0–46.6)
Hemoglobin: 14.2 g/dL (ref 11.1–15.9)
Immature Grans (Abs): 0 10*3/uL (ref 0.0–0.1)
Immature Granulocytes: 0 %
Lymphocytes Absolute: 1.6 10*3/uL (ref 0.7–3.1)
Lymphs: 20 %
MCH: 30.6 pg (ref 26.6–33.0)
MCHC: 33.5 g/dL (ref 31.5–35.7)
MCV: 91 fL (ref 79–97)
Monocytes Absolute: 0.8 10*3/uL (ref 0.1–0.9)
Monocytes: 10 %
Neutrophils Absolute: 5.5 10*3/uL (ref 1.4–7.0)
Neutrophils: 67 %
Platelets: 300 10*3/uL (ref 150–450)
RBC: 4.64 x10E6/uL (ref 3.77–5.28)
RDW: 12.9 % (ref 11.7–15.4)
WBC: 8.1 10*3/uL (ref 3.4–10.8)

## 2022-03-02 LAB — LIPID PANEL
Chol/HDL Ratio: 3.1 ratio (ref 0.0–4.4)
Cholesterol, Total: 178 mg/dL (ref 100–199)
HDL: 57 mg/dL (ref >39)
LDL Chol Calc (NIH): 102 mg/dL — ABNORMAL HIGH (ref 0–99)
Triglycerides: 107 mg/dL (ref 0–149)
VLDL Cholesterol Cal: 19 mg/dL (ref 5–40)

## 2022-03-27 DIAGNOSIS — M9903 Segmental and somatic dysfunction of lumbar region: Secondary | ICD-10-CM | POA: Diagnosis not present

## 2022-03-27 DIAGNOSIS — M9905 Segmental and somatic dysfunction of pelvic region: Secondary | ICD-10-CM | POA: Diagnosis not present

## 2022-03-27 DIAGNOSIS — M5137 Other intervertebral disc degeneration, lumbosacral region: Secondary | ICD-10-CM | POA: Diagnosis not present

## 2022-03-27 DIAGNOSIS — M9904 Segmental and somatic dysfunction of sacral region: Secondary | ICD-10-CM | POA: Diagnosis not present

## 2022-04-24 DIAGNOSIS — M9903 Segmental and somatic dysfunction of lumbar region: Secondary | ICD-10-CM | POA: Diagnosis not present

## 2022-04-24 DIAGNOSIS — M5137 Other intervertebral disc degeneration, lumbosacral region: Secondary | ICD-10-CM | POA: Diagnosis not present

## 2022-04-24 DIAGNOSIS — M9905 Segmental and somatic dysfunction of pelvic region: Secondary | ICD-10-CM | POA: Diagnosis not present

## 2022-04-24 DIAGNOSIS — M9904 Segmental and somatic dysfunction of sacral region: Secondary | ICD-10-CM | POA: Diagnosis not present

## 2022-04-27 DIAGNOSIS — R309 Painful micturition, unspecified: Secondary | ICD-10-CM | POA: Diagnosis not present

## 2022-04-27 DIAGNOSIS — N3091 Cystitis, unspecified with hematuria: Secondary | ICD-10-CM | POA: Diagnosis not present

## 2022-05-16 ENCOUNTER — Ambulatory Visit (INDEPENDENT_AMBULATORY_CARE_PROVIDER_SITE_OTHER): Payer: Medicare Other | Admitting: *Deleted

## 2022-05-16 DIAGNOSIS — Z23 Encounter for immunization: Secondary | ICD-10-CM | POA: Diagnosis not present

## 2022-05-23 DIAGNOSIS — M9904 Segmental and somatic dysfunction of sacral region: Secondary | ICD-10-CM | POA: Diagnosis not present

## 2022-05-23 DIAGNOSIS — M9905 Segmental and somatic dysfunction of pelvic region: Secondary | ICD-10-CM | POA: Diagnosis not present

## 2022-05-23 DIAGNOSIS — M5137 Other intervertebral disc degeneration, lumbosacral region: Secondary | ICD-10-CM | POA: Diagnosis not present

## 2022-05-23 DIAGNOSIS — M9903 Segmental and somatic dysfunction of lumbar region: Secondary | ICD-10-CM | POA: Diagnosis not present
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
## 2022-06-20 DIAGNOSIS — M9903 Segmental and somatic dysfunction of lumbar region: Secondary | ICD-10-CM | POA: Diagnosis not present

## 2022-06-20 DIAGNOSIS — M9905 Segmental and somatic dysfunction of pelvic region: Secondary | ICD-10-CM | POA: Diagnosis not present

## 2022-06-20 DIAGNOSIS — M9904 Segmental and somatic dysfunction of sacral region: Secondary | ICD-10-CM | POA: Diagnosis not present

## 2022-06-20 DIAGNOSIS — M5137 Other intervertebral disc degeneration, lumbosacral region: Secondary | ICD-10-CM | POA: Diagnosis not present

## 2022-07-18 DIAGNOSIS — M9905 Segmental and somatic dysfunction of pelvic region: Secondary | ICD-10-CM | POA: Diagnosis not present

## 2022-07-18 DIAGNOSIS — M9903 Segmental and somatic dysfunction of lumbar region: Secondary | ICD-10-CM | POA: Diagnosis not present

## 2022-07-18 DIAGNOSIS — M5137 Other intervertebral disc degeneration, lumbosacral region: Secondary | ICD-10-CM | POA: Diagnosis not present

## 2022-07-18 DIAGNOSIS — M9904 Segmental and somatic dysfunction of sacral region: Secondary | ICD-10-CM | POA: Diagnosis not present

## 2022-08-19 ENCOUNTER — Other Ambulatory Visit: Payer: Self-pay | Admitting: Family Medicine

## 2022-08-19 DIAGNOSIS — Z1231 Encounter for screening mammogram for malignant neoplasm of breast: Secondary | ICD-10-CM

## 2022-09-04 ENCOUNTER — Ambulatory Visit
Admission: RE | Admit: 2022-09-04 | Discharge: 2022-09-04 | Disposition: A | Discharge status: Home / Self Care | Payer: Medicare Other | Source: Ambulatory Visit | Attending: Family Medicine | Admitting: Family Medicine

## 2022-09-04 ENCOUNTER — Encounter: Payer: Self-pay | Admitting: Family Medicine

## 2022-09-04 ENCOUNTER — Ambulatory Visit (INDEPENDENT_AMBULATORY_CARE_PROVIDER_SITE_OTHER): Payer: Medicare Other | Admitting: Family Medicine

## 2022-09-04 VITALS — BP 134/80 | HR 66 | Ht 65.0 in | Wt 151.0 lb

## 2022-09-04 DIAGNOSIS — I10 Essential (primary) hypertension: Secondary | ICD-10-CM

## 2022-09-04 DIAGNOSIS — Z1231 Encounter for screening mammogram for malignant neoplasm of breast: Secondary | ICD-10-CM | POA: Diagnosis not present

## 2022-09-04 DIAGNOSIS — K589 Irritable bowel syndrome without diarrhea: Secondary | ICD-10-CM | POA: Diagnosis not present

## 2022-09-04 DIAGNOSIS — E782 Mixed hyperlipidemia: Secondary | ICD-10-CM | POA: Diagnosis not present

## 2022-09-04 MED ORDER — LINACLOTIDE 72 MCG PO CAPS: 72.0000 ug | ORAL_CAPSULE | Freq: Every day | ORAL | 3 refills | Status: DC

## 2022-09-04 MED ORDER — ATORVASTATIN CALCIUM 40 MG PO TABS: 40.0000 mg | ORAL_TABLET | Freq: Every day | ORAL | 3 refills | Status: DC

## 2022-09-04 MED ORDER — HYDROCHLOROTHIAZIDE 25 MG PO TABS: 25.0000 mg | ORAL_TABLET | Freq: Every day | ORAL | 3 refills | Status: DC

## 2022-09-04 MED ORDER — METOPROLOL SUCCINATE ER 25 MG PO TB24: 25.0000 mg | ORAL_TABLET | Freq: Every day | ORAL | 3 refills | Status: DC

## 2022-09-04 NOTE — Progress Notes (Signed)
BP 134/80   Pulse 66   Ht  (1.651 m)   Wt 151 lb (68.5 kg)   SpO2 97%   BMI 25.13 kg/m    Subjective:   Patient ID: Teresa Quinn, female    DOB: 04-24-1946, 77 y.o.   MRN: [id number]  HPI: Teresa Quinn is a 77 y.o. female presenting on 09/04/2022 for Medical Management of Chronic Issues, Hypertension, and Hyperlipidemia   HPI Hypertension Patient is currently on hydrochlorothiazide and metoprolol, and their blood pressure today is 134/80. Patient denies any lightheadedness or dizziness. Patient denies headaches, blurred vision, chest pains, shortness of breath, or weakness. Denies any side effects from medication and is content with current medication.   Hyperlipidemia Patient is coming in for recheck of his hyperlipidemia. The patient is currently taking fish oils and atorvastatin. They deny any issues with myalgias or history of liver damage from it. They deny any focal numbness or weakness or chest pain.   Chronic constipation and irritable bowel syndrome. Patient has chronic constipation and irritable bowels and takes Linzess and it does help.  She did have a positive Cologuard and has not gone to do her colonoscopy and she says she does not really want to, we encouraged that she think about going, the referral had already been placed from before.  Relevant past medical, surgical, family and social history reviewed and updated as indicated. Interim medical history since our last visit reviewed. Allergies and medications reviewed and updated.  Review of Systems  Constitutional:  Negative for chills and fever.  Eyes:  Negative for visual disturbance.  Respiratory:  Negative for chest tightness and shortness of breath.   Cardiovascular:  Negative for chest pain and leg swelling.  Musculoskeletal:  Negative for back pain and gait problem.  Skin:  Negative for rash.  Neurological:  Negative for dizziness, light-headedness and headaches.  Psychiatric/Behavioral:   Negative for agitation and behavioral problems.   All other systems reviewed and are negative.   Per HPI unless specifically indicated above   Allergies as of 09/04/2022       Reactions   Elemental Sulfur Rash        Medication List        Accurate as of September 04, 2022  1:26 PM. If you have any questions, ask your nurse or doctor.          STOP taking these medications    polyethylene glycol-electrolytes 420 g solution Commonly known as: NuLYTELY Stopped by: Elige Radon Perri Lamagna, MD       TAKE these medications    aspirin EC 81 MG tablet Take 81 mg by mouth daily.   atorvastatin 40 MG tablet Commonly known as: LIPITOR Take 1 tablet (40 mg total) by mouth daily.   famotidine 20 MG tablet Commonly known as: PEPCID Take 20 mg by mouth daily as needed.   Fish Oil 1000 MG Caps Take by mouth.   hydrochlorothiazide 25 MG tablet Commonly known as: HYDRODIURIL Take 1 tablet (25 mg total) by mouth daily.   linaclotide 72 MCG capsule Commonly known as: Linzess Take 1 capsule (72 mcg total) by mouth daily before breakfast.   loratadine 10 MG tablet Commonly known as: CLARITIN Take 10 mg by mouth daily.   Lysine 500 MG Tabs Take by mouth.   metoprolol succinate 25 MG 24 hr tablet Commonly known as: TOPROL-XL Take 1 tablet (25 mg total) by mouth daily.   multivitamin tablet Take 1 tablet by mouth daily.  VITAMIN B 12 PO Take by mouth.         Objective:   BP 134/80   Pulse 66   Ht  (1.651 m)   Wt 151 lb (68.5 kg)   SpO2 97%   BMI 25.13 kg/m   Wt Readings from Last 3 Encounters:  09/04/22 151 lb (68.5 kg)  03/01/22 151 lb (68.5 kg)  11/14/21 158 lb 9.6 oz (71.9 kg)    Physical Exam Vitals and nursing note reviewed.  Constitutional:      General: She is not in acute distress.    Appearance: She is well-developed. She is not diaphoretic.  Eyes:     Conjunctiva/sclera: Conjunctivae normal.  Cardiovascular:     Rate and Rhythm:  Normal rate and regular rhythm.     Heart sounds: Normal heart sounds. No murmur heard. Pulmonary:     Effort: Pulmonary effort is normal. No respiratory distress.     Breath sounds: Normal breath sounds. No wheezing.  Musculoskeletal:        General: No swelling or tenderness. Normal range of motion.  Skin:    General: Skin is warm and dry.     Findings: No rash.  Neurological:     Mental Status: She is alert and oriented to person, place, and time.     Coordination: Coordination normal.  Psychiatric:        Behavior: Behavior normal.       Assessment & Plan:   Problem List Items Addressed This Visit       Cardiovascular and Mediastinum   Essential hypertension - Primary   Relevant Medications   atorvastatin (LIPITOR) 40 MG tablet   hydrochlorothiazide (HYDRODIURIL) 25 MG tablet   metoprolol succinate (TOPROL-XL) 25 MG 24 hr tablet   Other Relevant Orders   CBC with Differential/Platelet   CMP14+EGFR     Digestive   Irritable bowel syndrome   Relevant Medications   linaclotide (LINZESS) 72 MCG capsule     Other   Hyperlipidemia   Relevant Medications   atorvastatin (LIPITOR) 40 MG tablet   hydrochlorothiazide (HYDRODIURIL) 25 MG tablet   metoprolol succinate (TOPROL-XL) 25 MG 24 hr tablet   Other Relevant Orders   CMP14+EGFR   Lipid panel    Continue current medicine, blood pressure looks good.  No changes.  She stays pretty active.  Encouraged activity.  She is going to have her mammogram today  Encouraged her to get her colonoscopy because of her positive Cologuard. Follow up plan: Return in about 6 months (around 03/06/2023), or if symptoms worsen or fail to improve, for Physical exam and hypertension and cholesterol.  Counseling provided for all of the vaccine components Orders Placed This Encounter  Procedures   CBC with Differential/Platelet   CMP14+EGFR   Lipid panel    Arville Care, MD [facility] 09/04/2022, 1:26  PM

## 2022-09-05 LAB — LIPID PANEL
Chol/HDL Ratio: 2.7 ratio (ref 0.0–4.4)
Cholesterol, Total: 173 mg/dL (ref 100–199)
HDL: 64 mg/dL (ref >39)
LDL Chol Calc (NIH): 90 mg/dL (ref 0–99)
Triglycerides: 107 mg/dL (ref 0–149)
VLDL Cholesterol Cal: 19 mg/dL (ref 5–40)

## 2022-09-05 LAB — CBC WITH DIFFERENTIAL/PLATELET
Basophils Absolute: 0.1 10*3/uL (ref 0.0–0.2)
Basos: 1 %
EOS (ABSOLUTE): 0.1 10*3/uL (ref 0.0–0.4)
Eos: 2 %
Hematocrit: 41.8 % (ref 34.0–46.6)
Hemoglobin: 13.8 g/dL (ref 11.1–15.9)
Immature Grans (Abs): 0 10*3/uL (ref 0.0–0.1)
Immature Granulocytes: 0 %
Lymphocytes Absolute: 2 10*3/uL (ref 0.7–3.1)
Lymphs: 24 %
MCH: 31.2 pg (ref 26.6–33.0)
MCHC: 33 g/dL (ref 31.5–35.7)
MCV: 95 fL (ref 79–97)
Monocytes Absolute: 0.6 10*3/uL (ref 0.1–0.9)
Monocytes: 7 %
Neutrophils Absolute: 5.3 10*3/uL (ref 1.4–7.0)
Neutrophils: 66 %
Platelets: 303 10*3/uL (ref 150–450)
RBC: 4.42 x10E6/uL (ref 3.77–5.28)
RDW: 12.4 % (ref 11.7–15.4)
WBC: 8 10*3/uL (ref 3.4–10.8)

## 2022-09-05 LAB — CMP14+EGFR
ALT: 22 IU/L (ref 0–32)
AST: 36 IU/L (ref 0–40)
Albumin/Globulin Ratio: 1.8 (ref 1.2–2.2)
Albumin: 4.6 g/dL (ref 3.8–4.8)
Alkaline Phosphatase: 86 IU/L (ref 44–121)
BUN/Creatinine Ratio: 17 (ref 12–28)
BUN: 13 mg/dL (ref 8–27)
Bilirubin Total: 0.4 mg/dL (ref 0.0–1.2)
CO2: 26 mmol/L (ref 20–29)
Calcium: 10.2 mg/dL (ref 8.7–10.3)
Chloride: 98 mmol/L (ref 96–106)
Creatinine, Ser: 0.75 mg/dL (ref 0.57–1.00)
Globulin, Total: 2.5 g/dL (ref 1.5–4.5)
Glucose: 83 mg/dL (ref 70–99)
Potassium: 5 mmol/L (ref 3.5–5.2)
Sodium: 137 mmol/L (ref 134–144)
Total Protein: 7.1 g/dL (ref 6.0–8.5)
eGFR: 82 mL/min/{1.73_m2} (ref >59)

## 2022-09-24 DIAGNOSIS — N3001 Acute cystitis with hematuria: Secondary | ICD-10-CM | POA: Diagnosis not present

## 2022-09-24 DIAGNOSIS — R309 Painful micturition, unspecified: Secondary | ICD-10-CM | POA: Diagnosis not present

## 2022-10-01 ENCOUNTER — Ambulatory Visit (INDEPENDENT_AMBULATORY_CARE_PROVIDER_SITE_OTHER): Payer: Medicare Other

## 2022-10-01 VITALS — Ht 62.0 in | Wt 152.0 lb

## 2022-10-01 DIAGNOSIS — Z0000 Encounter for general adult medical examination without abnormal findings: Secondary | ICD-10-CM

## 2022-10-01 NOTE — Progress Notes (Signed)
Subjective:   Teresa Quinn is a 77 y.o. female who presents for Medicare Annual (Subsequent) preventive examination. I connected with  Tiffanyann Canipe Fuhrer on 10/01/22 by a audio enabled telemedicine application and verified that I am speaking with the correct person using two identifiers.  Patient Location: Home  Provider Location: Home Office  I discussed the limitations of evaluation and management by telemedicine. The patient expressed understanding and agreed to proceed.  Review of Systems    Cardiac Risk Factors include: advanced age (>65men, >29 women)     Objective:    Today's Vitals   10/01/22 1447  Weight: 152 lb (68.9 kg)  Height: 5\' 2"  (1.575 m)   Body mass index is 27.8 kg/m.     10/01/2022    2:50 PM 10/27/2021    7:23 PM 09/28/2021    2:58 PM 09/27/2020    4:41 PM 09/03/2019    8:15 AM  Advanced Directives  Does Patient Have a Medical Advance Directive? Yes No Yes Yes Yes  Type of Estate agent of [place];Living will  Healthcare Power of [place];Living will Healthcare Power of [place];Living will Living will  Does patient want to make changes to medical advance directive?     No - Patient declined  Copy of Healthcare Power of Attorney in Chart? No - copy requested  No - copy requested No - copy requested   Would patient like information on creating a medical advance directive?     No - Patient declined    Current Medications (verified) Outpatient Encounter Medications as of 10/01/2022  Medication Sig   aspirin EC 81 MG tablet Take 81 mg by mouth daily.   atorvastatin (LIPITOR) 40 MG tablet Take 1 tablet (40 mg total) by mouth daily.   Cyanocobalamin (VITAMIN B 12 PO) Take by mouth.   famotidine (PEPCID) 20 MG tablet Take 20 mg by mouth daily as needed.   hydrochlorothiazide (HYDRODIURIL) 25 MG tablet Take 1 tablet (25 mg total) by mouth daily.   linaclotide (LINZESS) 72 MCG capsule Take 1 capsule (72 mcg total) by mouth daily before  breakfast.   loratadine (CLARITIN) 10 MG tablet Take 10 mg by mouth daily.   metoprolol succinate (TOPROL-XL) 25 MG 24 hr tablet Take 1 tablet (25 mg total) by mouth daily.   Multiple Vitamin (MULTIVITAMIN) tablet Take 1 tablet by mouth daily.   Omega-3 Fatty Acids (FISH OIL) 1000 MG CAPS Take by mouth.   Lysine 500 MG TABS Take by mouth.   No facility-administered encounter medications on file as of 10/01/2022.    Allergies (verified) Elemental sulfur   History: Past Medical History:  Diagnosis Date   Hyperlipidemia    Hypertension    Past Surgical History:  Procedure Laterality Date   ABDOMINAL HYSTERECTOMY  1991   Family History  Problem Relation Age of Onset   Hypertension Mother    Hyperlipidemia Father    Hypertension Father    Breast cancer Sister    Cancer - Colon Neg Hx    Social History   Socioeconomic History   Marital status: Widowed    Spouse name: Not on file   Number of children: 1   Years of education: Not on file   Highest education level: Not on file  Occupational History   Occupation: Realtor  Tobacco Use   Smoking status: Never   Smokeless tobacco: Never  Substance and Sexual Activity   Alcohol use: No   Drug use: No   Sexual  activity: Never  Other Topics Concern   Not on file  Social History Narrative   Have one son who lives in [place]   She is still working full time - Veterinary surgeon   Social Determinants of Health   Financial Resource Strain: Low Risk  (10/01/2022)   Overall Financial Resource Strain (CARDIA)    Difficulty of Paying Living Expenses: Not hard at all  Food Insecurity: No Food Insecurity (10/01/2022)   Hunger Vital Sign    Worried About Running Out of Food in the Last Year: Never true    Ran Out of Food in the Last Year: Never true  Transportation Needs: No Transportation Needs (10/01/2022)   PRAPARE - Administrator, Civil Service (Medical): No    Lack of Transportation (Non-Medical): No  Physical Activity:  Insufficiently Active (10/01/2022)   Exercise Vital Sign    Days of Exercise per Week: 3 days    Minutes of Exercise per Session: 30 min  Stress: No Stress Concern Present (10/01/2022)   [employer] of Occupational Health - Occupational Stress Questionnaire    Feeling of Stress : Not at all  Social Connections: Moderately Isolated (10/01/2022)   Social Connection and Isolation Panel [NHANES]    Frequency of Communication with Friends and Family: More than three times a week    Frequency of Social Gatherings with Friends and Family: More than three times a week    Attends Religious Services: More than 4 times per year    Active Member of [employer] or Organizations: No    Attends Banker Meetings: Never    Marital Status: Widowed    Tobacco Counseling Counseling given: Not Answered   Clinical Intake:  Pre-visit preparation completed: Yes  Pain : No/denies pain     Nutritional Risks: None Diabetes: No  How often do you need to have someone help you when you read instructions, pamphlets, or other written materials from your doctor or pharmacy?: 1 - Never  Diabetic?no   Interpreter Needed?: No  Information entered by :: Renie Ora, LPN   Activities of Daily Living    10/01/2022    2:50 PM  In your present state of health, do you have any difficulty performing the following activities:  Hearing? 0  Vision? 0  Difficulty concentrating or making decisions? 0  Walking or climbing stairs? 0  Dressing or bathing? 0  Doing errands, shopping? 0  Preparing Food and eating ? N  Using the Toilet? N  In the past six months, have you accidently leaked urine? N  Do you have problems with loss of bowel control? N  Managing your Medications? N  Managing your Finances? N  Housekeeping or managing your Housekeeping? N    Patient Care Team: Dettinger, Elige Radon, MD as PCP - General (Family Medicine) Michaelle Copas, MD as Referring Physician (Optometry) Charlton Haws,  Candelaria Stagers, MD as Referring Physician (Dermatology)  Indicate any recent Medical Services you may have received from other than [facility] providers in the past year (date may be approximate).     Assessment:   This is a routine wellness examination for [facility].  Hearing/Vision screen Vision Screening - Comments:: Wears rx glasses - up to date with routine eye exams with  Dr.Lee   Dietary issues and exercise activities discussed: Current Exercise Habits: Home exercise routine, Type of exercise: walking, Time (Minutes): 30, Frequency (Times/Week): 3, Weekly Exercise (Minutes/Week): 90, Intensity: Mild, Exercise limited by: None identified   Goals Addressed  This Visit's Progress    Exercise 3x per week (30 min per time)   On track      Depression Screen    10/01/2022    2:49 PM 09/04/2022    1:09 PM 03/01/2022    1:03 PM 09/28/2021    2:56 PM 08/30/2021    1:07 PM 03/01/2021    3:22 PM 09/27/2020    4:28 PM  PHQ 2/9 Scores  PHQ - 2 Score 0 0 0 0 0 0 0  PHQ- 9 Score 0  0 0 0 0     Fall Risk    10/01/2022    2:48 PM 09/04/2022    1:09 PM 03/01/2022    1:03 PM 09/28/2021    2:55 PM 08/30/2021    1:07 PM  Fall Risk   Falls in the past year? 0 0 0 0 0  Number falls in past yr: 0   0   Injury with Fall? 0   0   Risk for fall due to : No Fall Risks   No Fall Risks   Follow up Falls prevention discussed   Falls prevention discussed     FALL RISK PREVENTION PERTAINING TO THE HOME:  Any stairs in or around the home? No  If so, are there any without handrails? No  Home free of loose throw rugs in walkways, pet beds, electrical cords, etc? Yes  Adequate lighting in your home to reduce risk of falls? Yes   ASSISTIVE DEVICES UTILIZED TO PREVENT FALLS:  Life alert? No  Use of a cane, walker or w/c? No  Grab bars in the bathroom? Yes  Shower chair or bench in shower? Yes  Elevated toilet seat or a handicapped toilet? Yes          10/01/2022    2:50 PM 09/28/2021     2:59 PM 09/03/2019    8:12 AM  6CIT Screen  What Year? 0 points 0 points 0 points  What month? 0 points 0 points 0 points  What time? 0 points 0 points 0 points  Count back from 20 0 points 0 points 0 points  Months in reverse 0 points 0 points 0 points  Repeat phrase 0 points 2 points 0 points  Total Score 0 points 2 points 0 points    Immunizations Immunization History  Administered Date(s) Administered   Fluad Quad(high Dose 65+) 02/17/2019, 03/03/2020, 05/16/2022   Influenza, High Dose Seasonal PF 02/25/2017, 02/24/2018   Influenza,inj,Quad PF,6+ Mos 02/11/2016   Moderna Sars-Covid-2 Vaccination 07/15/2019, 08/20/2019, 04/14/2020   Pneumococcal Conjugate-13 03/01/2014   Pneumococcal Polysaccharide-23 01/20/2019   Zoster Recombinat (Shingrix) 08/30/2021, 03/01/2022    TDAP status: Due, Education has been provided regarding the importance of this vaccine. Advised may receive this vaccine at local pharmacy or Health Dept. Aware to provide a copy of the vaccination record if obtained from local pharmacy or Health Dept. Verbalized acceptance and understanding.  Flu Vaccine status: Up to date  Pneumococcal vaccine status: Up to date  Covid-19 vaccine status: Completed vaccines  Qualifies for Shingles Vaccine? Yes   Zostavax completed Yes   Shingrix Completed?: Yes  Screening Tests Health Maintenance  Topic Date Due   DTaP/Tdap/Td (1 - Tdap) Never done   COVID-19 Vaccine (4 - 2023-24 season) 01/11/2022   INFLUENZA VACCINE  12/12/2022   DEXA SCAN  03/15/2023   MAMMOGRAM  09/04/2023   Medicare Annual Wellness (AWV)  10/01/2023   Pneumonia Vaccine 25+ Years old  Completed  Hepatitis C Screening  Completed   Zoster Vaccines- Shingrix  Completed   HPV VACCINES  Aged Out   COLONOSCOPY (Pts 45-63yrs Insurance coverage will need to be confirmed)  Discontinued    Health Maintenance  Health Maintenance Due  Topic Date Due   DTaP/Tdap/Td (1 - Tdap) Never done   COVID-19  Vaccine (4 - 2023-24 season) 01/11/2022    Colorectal cancer screening: No longer required.   Mammogram status: No longer required due to age .  Bone Density status: Completed 03/15/2023. Results reflect: Bone density results: OSTEOPOROSIS. Repeat every 2 years.  Lung Cancer Screening: (Low Dose CT Chest recommended if Age 76-80 years, 30 pack-year currently smoking OR have quit w/in 15years.) does not qualify.   Lung Cancer Screening Referral: n/a  Additional Screening:  Hepatitis C Screening: does not qualify;  Vision Screening: Recommended annual ophthalmology exams for early detection of glaucoma and other disorders of the eye. Is the patient up to date with their annual eye exam?  Yes  Who is the provider or what is the name of the office in which the patient attends annual eye exams? Dr.Lee  If pt is not established with a provider, would they like to be referred to a provider to establish care? No .   Dental Screening: Recommended annual dental exams for proper oral hygiene  Community Resource Referral / Chronic Care Management: CRR required this visit?  No   CCM required this visit?  No      Plan:     I have personally reviewed and noted the following in the patient's chart:   Medical and social history Use of alcohol, tobacco or illicit drugs  Current medications and supplements including opioid prescriptions. Patient is not currently taking opioid prescriptions. Functional ability and status Nutritional status Physical activity Advanced directives List of other physicians Hospitalizations, surgeries, and ER visits in previous 12 months Vitals Screenings to include cognitive, depression, and falls Referrals and appointments  In addition, I have reviewed and discussed with patient certain preventive protocols, quality metrics, and best practice recommendations. A written personalized care plan for preventive services as well as general preventive health  recommendations were provided to patient.     Lorrene Reid, LPN   10/15/5407   Nurse Notes: none

## 2022-10-01 NOTE — Patient Instructions (Signed)
[name] , Thank you for taking time to come for your Medicare Wellness Visit. I appreciate your ongoing commitment to your health goals. Please review the following plan we discussed and let me know if I can assist you in the future.   These are the goals we discussed:  Goals      Blood Pressure < 130/80     Exercise 3x per week (30 min per time)        This is a list of the screening recommended for you and due dates:  Health Maintenance  Topic Date Due   DTaP/Tdap/Td vaccine (1 - Tdap) Never done   COVID-19 Vaccine (4 - 2023-24 season) 01/11/2022   Flu Shot  12/12/2022   DEXA scan (bone density measurement)  03/15/2023   Mammogram  09/04/2023   Medicare Annual Wellness Visit  10/01/2023   Pneumonia Vaccine  Completed   Hepatitis C Screening: USPSTF Recommendation to screen - Ages 18-79 yo.  Completed   Zoster (Shingles) Vaccine  Completed   HPV Vaccine  Aged Out   Colon Cancer Screening  Discontinued    Advanced directives: Advance directive discussed with you today. I have provided a copy for you to complete at home and have notarized. Once this is complete please bring a copy in to our office so we can scan it into your chart.   Conditions/risks identified: Aim for 30 minutes of exercise or brisk walking, 6-8 glasses of water, and 5 servings of fruits and vegetables each day.   Next appointment: Follow up in one year for your annual wellness visit    Preventive Care 65 Years and Older, Female Preventive care refers to lifestyle choices and visits with your health care provider that can promote health and wellness. What does preventive care include? A yearly physical exam. This is also called an annual well check. Dental exams once or twice a year. Routine eye exams. Ask your health care provider how often you should have your eyes checked. Personal lifestyle choices, including: Daily care of your teeth and gums. Regular physical activity. Eating a healthy  diet. Avoiding tobacco and drug use. Limiting alcohol use. Practicing safe sex. Taking low-dose aspirin every day. Taking vitamin and mineral supplements as recommended by your health care provider. What happens during an annual well check? The services and screenings done by your health care provider during your annual well check will depend on your age, overall health, lifestyle risk factors, and family history of disease. Counseling  Your health care provider may ask you questions about your: Alcohol use. Tobacco use. Drug use. Emotional well-being. Home and relationship well-being. Sexual activity. Eating habits. History of falls. Memory and ability to understand (cognition). Work and work Astronomer. Reproductive health. Screening  You may have the following tests or measurements: Height, weight, and BMI. Blood pressure. Lipid and cholesterol levels. These may be checked every 5 years, or more frequently if you are over 32 years old. Skin check. Lung cancer screening. You may have this screening every year starting at age 48 if you have a 30-pack-year history of smoking and currently smoke or have quit within the past 15 years. Fecal occult blood test (FOBT) of the stool. You may have this test every year starting at age 53. Flexible sigmoidoscopy or colonoscopy. You may have a sigmoidoscopy every 5 years or a colonoscopy every 10 years starting at age 71. Hepatitis C blood test. Hepatitis B blood test. Sexually transmitted disease (STD) testing. Diabetes screening. This is  done by checking your blood sugar (glucose) after you have not eaten for a while (fasting). You may have this done every 1-3 years. Bone density scan. This is done to screen for osteoporosis. You may have this done starting at age 42. Mammogram. This may be done every 1-2 years. Talk to your health care provider about how often you should have regular mammograms. Talk with your health care provider about  your test results, treatment options, and if necessary, the need for more tests. Vaccines  Your health care provider may recommend certain vaccines, such as: Influenza vaccine. This is recommended every year. Tetanus, diphtheria, and acellular pertussis (Tdap, Td) vaccine. You may need a Td booster every 10 years. Zoster vaccine. You may need this after age 18. Pneumococcal 13-valent conjugate (PCV13) vaccine. One dose is recommended after age 42. Pneumococcal polysaccharide (PPSV23) vaccine. One dose is recommended after age 64. Talk to your health care provider about which screenings and vaccines you need and how often you need them. This information is not intended to replace advice given to you by your health care provider. Make sure you discuss any questions you have with your health care provider. Document Released: 05/26/2015 Document Revised: 01/17/2016 Document Reviewed: 02/28/2015 Elsevier Interactive Patient Education  2017 [employer].  Fall Prevention in the Home Falls can cause injuries. They can happen to people of all ages. There are many things you can do to make your home safe and to help prevent falls. What can I do on the outside of my home? Regularly fix the edges of walkways and driveways and fix any cracks. Remove anything that might make you trip as you walk through a door, such as a raised step or threshold. Trim any bushes or trees on the path to your home. Use bright outdoor lighting. Clear any walking paths of anything that might make someone trip, such as rocks or tools. Regularly check to see if handrails are loose or broken. Make sure that both sides of any steps have handrails. Any raised decks and porches should have guardrails on the edges. Have any leaves, snow, or ice cleared regularly. Use sand or salt on walking paths during winter. Clean up any spills in your garage right away. This includes oil or grease spills. What can I do in the bathroom? Use  night lights. Install grab bars by the toilet and in the tub and shower. Do not use towel bars as grab bars. Use non-skid mats or decals in the tub or shower. If you need to sit down in the shower, use a plastic, non-slip stool. Keep the floor dry. Clean up any water that spills on the floor as soon as it happens. Remove soap buildup in the tub or shower regularly. Attach bath mats securely with double-sided non-slip rug tape. Do not have throw rugs and other things on the floor that can make you trip. What can I do in the bedroom? Use night lights. Make sure that you have a light by your bed that is easy to reach. Do not use any sheets or blankets that are too big for your bed. They should not hang down onto the floor. Have a firm chair that has side arms. You can use this for support while you get dressed. Do not have throw rugs and other things on the floor that can make you trip. What can I do in the kitchen? Clean up any spills right away. Avoid walking on wet floors. Keep items that you  use a lot in easy-to-reach places. If you need to reach something above you, use a strong step stool that has a grab bar. Keep electrical cords out of the way. Do not use floor polish or wax that makes floors slippery. If you must use wax, use non-skid floor wax. Do not have throw rugs and other things on the floor that can make you trip. What can I do with my stairs? Do not leave any items on the stairs. Make sure that there are handrails on both sides of the stairs and use them. Fix handrails that are broken or loose. Make sure that handrails are as long as the stairways. Check any carpeting to make sure that it is firmly attached to the stairs. Fix any carpet that is loose or worn. Avoid having throw rugs at the top or bottom of the stairs. If you do have throw rugs, attach them to the floor with carpet tape. Make sure that you have a light switch at the top of the stairs and the bottom of the  stairs. If you do not have them, ask someone to add them for you. What else can I do to help prevent falls? Wear shoes that: Do not have high heels. Have rubber bottoms. Are comfortable and fit you well. Are closed at the toe. Do not wear sandals. If you use a stepladder: Make sure that it is fully opened. Do not climb a closed stepladder. Make sure that both sides of the stepladder are locked into place. Ask someone to hold it for you, if possible. Clearly mark and make sure that you can see: Any grab bars or handrails. First and last steps. Where the edge of each step is. Use tools that help you move around (mobility aids) if they are needed. These include: Canes. Walkers. Scooters. Crutches. Turn on the lights when you go into a dark area. Replace any light bulbs as soon as they burn out. Set up your furniture so you have a clear path. Avoid moving your furniture around. If any of your floors are uneven, fix them. If there are any pets around you, be aware of where they are. Review your medicines with your doctor. Some medicines can make you feel dizzy. This can increase your chance of falling. Ask your doctor what other things that you can do to help prevent falls. This information is not intended to replace advice given to you by your health care provider. Make sure you discuss any questions you have with your health care provider. Document Released: 02/23/2009 Document Revised: 10/05/2015 Document Reviewed: 06/03/2014 Elsevier Interactive Patient Education  2017 [employer].

## 2023-01-30 DIAGNOSIS — M9903 Segmental and somatic dysfunction of lumbar region: Secondary | ICD-10-CM | POA: Diagnosis not present

## 2023-01-30 DIAGNOSIS — M5137 Other intervertebral disc degeneration, lumbosacral region: Secondary | ICD-10-CM | POA: Diagnosis not present

## 2023-01-30 DIAGNOSIS — M9905 Segmental and somatic dysfunction of pelvic region: Secondary | ICD-10-CM | POA: Diagnosis not present

## 2023-01-30 DIAGNOSIS — M9904 Segmental and somatic dysfunction of sacral region: Secondary | ICD-10-CM | POA: Diagnosis not present

## 2023-02-03 DIAGNOSIS — M9905 Segmental and somatic dysfunction of pelvic region: Secondary | ICD-10-CM | POA: Diagnosis not present

## 2023-02-03 DIAGNOSIS — M9903 Segmental and somatic dysfunction of lumbar region: Secondary | ICD-10-CM | POA: Diagnosis not present

## 2023-02-03 DIAGNOSIS — M5137 Other intervertebral disc degeneration, lumbosacral region: Secondary | ICD-10-CM | POA: Diagnosis not present

## 2023-02-03 DIAGNOSIS — M9904 Segmental and somatic dysfunction of sacral region: Secondary | ICD-10-CM | POA: Diagnosis not present

## 2023-05-01 ENCOUNTER — Ambulatory Visit (INDEPENDENT_AMBULATORY_CARE_PROVIDER_SITE_OTHER): Payer: Medicare Other | Admitting: Family Medicine

## 2023-05-01 ENCOUNTER — Encounter: Payer: Self-pay | Admitting: Family Medicine

## 2023-05-01 VITALS — BP 145/88 | HR 79 | Temp 97.4°F | Ht 62.0 in | Wt 155.0 lb

## 2023-05-01 DIAGNOSIS — Z23 Encounter for immunization: Secondary | ICD-10-CM

## 2023-05-01 DIAGNOSIS — I10 Essential (primary) hypertension: Secondary | ICD-10-CM | POA: Diagnosis not present

## 2023-05-01 DIAGNOSIS — K5909 Other constipation: Secondary | ICD-10-CM

## 2023-05-01 DIAGNOSIS — E782 Mixed hyperlipidemia: Secondary | ICD-10-CM | POA: Diagnosis not present

## 2023-05-01 DIAGNOSIS — K589 Irritable bowel syndrome without diarrhea: Secondary | ICD-10-CM | POA: Diagnosis not present

## 2023-05-01 NOTE — Addendum Note (Signed)
Addended by: Dorene Sorrow on: 05/01/2023 02:42 PM   Modules accepted: Orders

## 2023-05-01 NOTE — Progress Notes (Addendum)
BP (!) 145/88   Pulse 79   Temp (!) 97.4 F (36.3 C)   Ht 5\' 2"  (1.575 m)   Wt 155 lb (70.3 kg)   SpO2 97%   BMI 28.35 kg/m    Subjective:   Patient ID: Teresa Quinn, female    DOB: May 25, 1945, 77 y.o.   MRN: [id number]  HPI: Teresa Quinn is a 77 y.o. female presenting on 05/01/2023 for Medical Management of Chronic Issues, Hyperlipidemia, and Hypertension   HPI Hypertension Patient is currently on hydrochlorothiazide and metoprolol, and their blood pressure today is 145/88 and 114/75. Patient denies any lightheadedness or dizziness. Patient denies headaches, blurred vision, chest pains, shortness of breath, or weakness. Denies any side effects from medication and is content with current medication.   Hyperlipidemia Patient is coming in for recheck of his hyperlipidemia. The patient is currently taking fish oils and atorvastatin. They deny any issues with myalgias or history of liver damage from it. They deny any focal numbness or weakness or chest pain.   IBS constipation Patient is coming in for recheck with IBS constipation, currently taking Linzess.  Feels like she does well on the medicine, no complaints.  Relevant past medical, surgical, family and social history reviewed and updated as indicated. Interim medical history since our last visit reviewed. Allergies and medications reviewed and updated.  Review of Systems  Constitutional:  Negative for chills and fever.  HENT:  Negative for congestion, ear discharge and ear pain.   Eyes:  Negative for redness and visual disturbance.  Respiratory:  Negative for chest tightness and shortness of breath.   Cardiovascular:  Negative for chest pain and leg swelling.  Genitourinary:  Negative for difficulty urinating and dysuria.  Musculoskeletal:  Negative for back pain and gait problem.  Skin:  Negative for rash.  Neurological:  Negative for light-headedness and headaches.  Psychiatric/Behavioral:  Negative for  agitation and behavioral problems.   All other systems reviewed and are negative.   Per HPI unless specifically indicated above   Allergies as of 05/01/2023       Reactions   Elemental Sulfur Rash        Medication List        Accurate as of May 01, 2023 10:34 AM. If you have any questions, ask your nurse or doctor.          STOP taking these medications    famotidine 20 MG tablet Commonly known as: PEPCID Stopped by: Elige Radon Giavanni Odonovan   Lysine 500 MG Tabs Stopped by: Elige Radon Reda Citron       TAKE these medications    aspirin EC 81 MG tablet Take 81 mg by mouth daily.   atorvastatin 40 MG tablet Commonly known as: LIPITOR Take 1 tablet (40 mg total) by mouth daily.   Fish Oil 1000 MG Caps Take by mouth.   hydrochlorothiazide 25 MG tablet Commonly known as: HYDRODIURIL Take 1 tablet (25 mg total) by mouth daily.   linaclotide 72 MCG capsule Commonly known as: Linzess Take 1 capsule (72 mcg total) by mouth daily before breakfast.   loratadine 10 MG tablet Commonly known as: CLARITIN Take 10 mg by mouth daily.   metoprolol succinate 25 MG 24 hr tablet Commonly known as: TOPROL-XL Take 1 tablet (25 mg total) by mouth daily.   multivitamin tablet Take 1 tablet by mouth daily.   VITAMIN B 12 PO Take by mouth.         Objective:  BP (!) 145/88   Pulse 79   Temp (!) 97.4 F (36.3 C)   Ht 5\' 2"  (1.575 m)   Wt 155 lb (70.3 kg)   SpO2 97%   BMI 28.35 kg/m   Wt Readings from Last 3 Encounters:  05/01/23 155 lb (70.3 kg)  10/01/22 152 lb (68.9 kg)  09/04/22 151 lb (68.5 kg)    Physical Exam Vitals and nursing note reviewed.  Constitutional:      General: She is not in acute distress.    Appearance: She is well-developed. She is not diaphoretic.  Eyes:     Conjunctiva/sclera: Conjunctivae normal.  Cardiovascular:     Rate and Rhythm: Normal rate and regular rhythm.     Heart sounds: Normal heart sounds. No murmur  heard. Pulmonary:     Effort: Pulmonary effort is normal. No respiratory distress.     Breath sounds: Normal breath sounds. No wheezing.  Musculoskeletal:        General: No swelling.  Skin:    General: Skin is warm and dry.     Findings: No rash.  Neurological:     Mental Status: She is alert and oriented to person, place, and time.     Coordination: Coordination normal.  Psychiatric:        Behavior: Behavior normal.      Assessment & Plan:   Problem List Items Addressed This Visit       Cardiovascular and Mediastinum   Essential hypertension - Primary   Relevant Orders   CBC with Differential/Platelet   CMP14+EGFR   Lipid panel     Digestive   Irritable bowel syndrome   Chronic constipation     Other   Hyperlipidemia   Relevant Orders   CBC with Differential/Platelet   CMP14+EGFR   Lipid panel    Will check blood work today but everything else looks good on exam, no changes. Follow up plan: Return in about 6 months (around 10/30/2023), or if symptoms worsen or fail to improve, for Hypertension and hyperlipidemia recheck.  Counseling provided for all of the vaccine components Orders Placed This Encounter  Procedures  . CBC with Differential/Platelet  . CMP14+EGFR  . Lipid panel    Arville Care, MD [facility] 05/01/2023, 10:34 AM

## 2023-05-02 LAB — CBC WITH DIFFERENTIAL/PLATELET
Basophils Absolute: 0.1 10*3/uL (ref 0.0–0.2)
Basos: 1 %
EOS (ABSOLUTE): 0.1 10*3/uL (ref 0.0–0.4)
Eos: 2 %
Hematocrit: 40.7 % (ref 34.0–46.6)
Hemoglobin: 13.7 g/dL (ref 11.1–15.9)
Immature Grans (Abs): 0 10*3/uL (ref 0.0–0.1)
Immature Granulocytes: 0 %
Lymphocytes Absolute: 1.4 10*3/uL (ref 0.7–3.1)
Lymphs: 27 %
MCH: 32.2 pg (ref 26.6–33.0)
MCHC: 33.7 g/dL (ref 31.5–35.7)
MCV: 96 fL (ref 79–97)
Monocytes Absolute: 0.5 10*3/uL (ref 0.1–0.9)
Monocytes: 8 %
Neutrophils Absolute: 3.4 10*3/uL (ref 1.4–7.0)
Neutrophils: 62 %
Platelets: 283 10*3/uL (ref 150–450)
RBC: 4.25 x10E6/uL (ref 3.77–5.28)
RDW: 11.7 % (ref 11.7–15.4)
WBC: 5.4 10*3/uL (ref 3.4–10.8)

## 2023-05-02 LAB — LIPID PANEL
Chol/HDL Ratio: 2.7 {ratio} (ref 0.0–4.4)
Cholesterol, Total: 176 mg/dL (ref 100–199)
HDL: 66 mg/dL (ref >39)
LDL Chol Calc (NIH): 95 mg/dL (ref 0–99)
Triglycerides: 84 mg/dL (ref 0–149)
VLDL Cholesterol Cal: 15 mg/dL (ref 5–40)

## 2023-05-02 LAB — CMP14+EGFR
ALT: 22 [IU]/L (ref 0–32)
AST: 34 [IU]/L (ref 0–40)
Albumin: 4.5 g/dL (ref 3.8–4.8)
Alkaline Phosphatase: 86 [IU]/L (ref 44–121)
BUN/Creatinine Ratio: 18 (ref 12–28)
BUN: 15 mg/dL (ref 8–27)
Bilirubin Total: 0.5 mg/dL (ref 0.0–1.2)
CO2: 25 mmol/L (ref 20–29)
Calcium: 10 mg/dL (ref 8.7–10.3)
Chloride: 99 mmol/L (ref 96–106)
Creatinine, Ser: 0.83 mg/dL (ref 0.57–1.00)
Globulin, Total: 2.4 g/dL (ref 1.5–4.5)
Glucose: 84 mg/dL (ref 70–99)
Potassium: 4.7 mmol/L (ref 3.5–5.2)
Sodium: 138 mmol/L (ref 134–144)
Total Protein: 6.9 g/dL (ref 6.0–8.5)
eGFR: 73 mL/min/{1.73_m2} (ref >59)

## 2023-08-24 ENCOUNTER — Other Ambulatory Visit: Payer: Self-pay | Admitting: Family Medicine

## 2023-08-24 DIAGNOSIS — I10 Essential (primary) hypertension: Secondary | ICD-10-CM

## 2023-10-02 ENCOUNTER — Other Ambulatory Visit: Payer: Self-pay | Admitting: Family Medicine

## 2023-10-02 DIAGNOSIS — E782 Mixed hyperlipidemia: Secondary | ICD-10-CM

## 2023-10-17 DIAGNOSIS — L03312 Cellulitis of back [any part except buttock]: Secondary | ICD-10-CM | POA: Diagnosis not present

## 2023-10-20 DIAGNOSIS — Z5189 Encounter for other specified aftercare: Secondary | ICD-10-CM | POA: Diagnosis not present

## 2023-10-20 DIAGNOSIS — S2096XD Insect bite (nonvenomous) of unspecified parts of thorax, subsequent encounter: Secondary | ICD-10-CM | POA: Diagnosis not present

## 2023-10-31 ENCOUNTER — Encounter: Payer: Self-pay | Admitting: Family Medicine

## 2023-10-31 ENCOUNTER — Ambulatory Visit: Payer: Medicare Other | Admitting: Family Medicine

## 2023-10-31 VITALS — BP 118/72 | HR 79 | Ht 62.0 in | Wt 157.0 lb

## 2023-10-31 DIAGNOSIS — I10 Essential (primary) hypertension: Secondary | ICD-10-CM

## 2023-10-31 DIAGNOSIS — E782 Mixed hyperlipidemia: Secondary | ICD-10-CM

## 2023-10-31 DIAGNOSIS — K589 Irritable bowel syndrome without diarrhea: Secondary | ICD-10-CM

## 2023-10-31 LAB — LIPID PANEL

## 2023-10-31 MED ORDER — HYDROCHLOROTHIAZIDE 25 MG PO TABS: 25.0000 mg | ORAL_TABLET | Freq: Every day | ORAL | 3 refills | Status: AC

## 2023-10-31 MED ORDER — ATORVASTATIN CALCIUM 40 MG PO TABS: 40.0000 mg | ORAL_TABLET | Freq: Every day | ORAL | 3 refills | Status: AC

## 2023-10-31 MED ORDER — METOPROLOL SUCCINATE ER 25 MG PO TB24: 25.0000 mg | ORAL_TABLET | Freq: Every day | ORAL | 3 refills | Status: AC

## 2023-10-31 MED ORDER — LINACLOTIDE 72 MCG PO CAPS: 72.0000 ug | ORAL_CAPSULE | Freq: Every day | ORAL | 3 refills | Status: AC

## 2023-10-31 NOTE — Progress Notes (Signed)
 BP 118/72   Pulse 79   Ht 5' 2 (1.575 m)   Wt 157 lb (71.2 kg)   SpO2 96%   BMI 28.72 kg/m    Subjective:   Patient ID: Teresa Quinn, female    DOB: 11-30-1945, 78 y.o.   MRN: [id number]  HPI: Teresa Quinn is a 78 y.o. female presenting on 10/31/2023 for Medical Management of Chronic Issues, Hyperlipidemia, and Hypertension   HPI Hypertension Patient is currently on hydrochlorothiazide  and metoprolol ,, and their blood pressure today is 118/72. Patient denies any lightheadedness or dizziness. Patient denies headaches, blurred vision, chest pains, shortness of breath, or weakness. Denies any side effects from medication and is content with current medication.   Hyperlipidemia Patient is coming in for recheck of his hyperlipidemia. The patient is currently taking atorvastatin . They deny any issues with myalgias or history of liver damage from it. They deny any focal numbness or weakness or chest pain.   IBS C Patient is coming in for recheck for IBS with constipation.  She currently takes Linzess .  She feels like it does well for her constipation is mostly under control and she has regular bowel movements.  Relevant past medical, surgical, family and social history reviewed and updated as indicated. Interim medical history since our last visit reviewed. Allergies and medications reviewed and updated.  Review of Systems  Constitutional:  Negative for chills and fever.  HENT:  Negative for congestion, ear discharge and ear pain.   Eyes:  Negative for redness and visual disturbance.  Respiratory:  Negative for chest tightness and shortness of breath.   Cardiovascular:  Negative for chest pain and leg swelling.  Genitourinary:  Negative for difficulty urinating and dysuria.  Musculoskeletal:  Negative for back pain and gait problem.  Skin:  Negative for rash.  Neurological:  Negative for dizziness, light-headedness and headaches.  Psychiatric/Behavioral:  Negative for  agitation and behavioral problems.   All other systems reviewed and are negative.   Per HPI unless specifically indicated above   Allergies as of 10/31/2023       Reactions   Elemental Sulfur Rash        Medication List        Accurate as of October 31, 2023  9:33 AM. If you have any questions, ask your nurse or doctor.          aspirin EC 81 MG tablet Take 81 mg by mouth daily.   atorvastatin  40 MG tablet Commonly known as: LIPITOR Take 1 tablet (40 mg total) by mouth daily.   Fish Oil 1000 MG Caps Take by mouth.   hydrochlorothiazide  25 MG tablet Commonly known as: HYDRODIURIL  Take 1 tablet (25 mg total) by mouth daily.   linaclotide  72 MCG capsule Commonly known as: Linzess  Take 1 capsule (72 mcg total) by mouth daily before breakfast.   loratadine 10 MG tablet Commonly known as: CLARITIN Take 10 mg by mouth daily.   metoprolol  succinate 25 MG 24 hr tablet Commonly known as: TOPROL -XL Take 1 tablet (25 mg total) by mouth daily.   multivitamin tablet Take 1 tablet by mouth daily.   VITAMIN B 12 PO Take by mouth.         Objective:   BP 118/72   Pulse 79   Ht 5' 2 (1.575 m)   Wt 157 lb (71.2 kg)   SpO2 96%   BMI 28.72 kg/m   Wt Readings from Last 3 Encounters:  10/31/23 157 lb (71.2 kg)  05/01/23 155 lb (70.3 kg)  10/01/22 152 lb (68.9 kg)    Physical Exam Vitals and nursing note reviewed.  Constitutional:      General: She is not in acute distress.    Appearance: She is well-developed. She is not diaphoretic.   Eyes:     Conjunctiva/sclera: Conjunctivae normal.    Cardiovascular:     Rate and Rhythm: Normal rate and regular rhythm.     Heart sounds: Normal heart sounds. No murmur heard. Pulmonary:     Effort: Pulmonary effort is normal. No respiratory distress.     Breath sounds: Normal breath sounds. No wheezing.   Musculoskeletal:        General: No swelling. Normal range of motion.   Skin:    General: Skin is warm and  dry.     Findings: No rash.   Neurological:     Mental Status: She is alert and oriented to person, place, and time.     Coordination: Coordination normal.   Psychiatric:        Behavior: Behavior normal.       Assessment & Plan:   Problem List Items Addressed This Visit       Cardiovascular and Mediastinum   Essential hypertension - Primary   Relevant Medications   atorvastatin  (LIPITOR) 40 MG tablet   hydrochlorothiazide  (HYDRODIURIL ) 25 MG tablet   metoprolol  succinate (TOPROL -XL) 25 MG 24 hr tablet   Other Relevant Orders   CBC with Differential/Platelet   CMP14+EGFR     Digestive   Irritable bowel syndrome   Relevant Medications   linaclotide  (LINZESS ) 72 MCG capsule     Other   Hyperlipidemia   Relevant Medications   atorvastatin  (LIPITOR) 40 MG tablet   hydrochlorothiazide  (HYDRODIURIL ) 25 MG tablet   metoprolol  succinate (TOPROL -XL) 25 MG 24 hr tablet   Other Relevant Orders   Lipid panel    Patient has a history of positive Cologuard but does not want to do her colonoscopy.  She did go see the gastroenterologist and talk with them but she says she is not planning on doing the procedure.  We discussed the risks and likelihood of precancer and she understands  Blood pressure and everything else looks good today.  No changes Follow up plan: Return in about 6 months (around 05/01/2024), or if symptoms worsen or fail to improve, for Physical exam and hypertension cholesterol.  Counseling provided for all of the vaccine components Orders Placed This Encounter  Procedures   CBC with Differential/Platelet   CMP14+EGFR   Lipid panel    Jolyne Needs, MD [facility] 10/31/2023, 9:33 AM

## 2023-11-01 LAB — CBC WITH DIFFERENTIAL/PLATELET
Basophils Absolute: 0.1 10*3/uL (ref 0.0–0.2)
Basos: 1 %
EOS (ABSOLUTE): 0.1 10*3/uL (ref 0.0–0.4)
Eos: 2 %
Hematocrit: 41.2 % (ref 34.0–46.6)
Hemoglobin: 13.9 g/dL (ref 11.1–15.9)
Immature Grans (Abs): 0 10*3/uL (ref 0.0–0.1)
Immature Granulocytes: 0 %
Lymphocytes Absolute: 1.7 10*3/uL (ref 0.7–3.1)
Lymphs: 24 %
MCH: 32.8 pg (ref 26.6–33.0)
MCHC: 33.7 g/dL (ref 31.5–35.7)
MCV: 97 fL (ref 79–97)
Monocytes Absolute: 0.6 10*3/uL (ref 0.1–0.9)
Monocytes: 9 %
Neutrophils Absolute: 4.4 10*3/uL (ref 1.4–7.0)
Neutrophils: 64 %
Platelets: 253 10*3/uL (ref 150–450)
RBC: 4.24 x10E6/uL (ref 3.77–5.28)
RDW: 11.8 % (ref 11.7–15.4)
WBC: 6.8 10*3/uL (ref 3.4–10.8)

## 2023-11-01 LAB — CMP14+EGFR
ALT: 25 IU/L (ref 0–32)
AST: 37 IU/L (ref 0–40)
Albumin: 4.5 g/dL (ref 3.8–4.8)
Alkaline Phosphatase: 85 IU/L (ref 44–121)
BUN/Creatinine Ratio: 19 (ref 12–28)
BUN: 15 mg/dL (ref 8–27)
Bilirubin Total: 0.7 mg/dL (ref 0.0–1.2)
CO2: 24 mmol/L (ref 20–29)
Calcium: 10 mg/dL (ref 8.7–10.3)
Chloride: 97 mmol/L (ref 96–106)
Creatinine, Ser: 0.77 mg/dL (ref 0.57–1.00)
Globulin, Total: 2.4 g/dL (ref 1.5–4.5)
Glucose: 87 mg/dL (ref 70–99)
Potassium: 4.4 mmol/L (ref 3.5–5.2)
Sodium: 135 mmol/L (ref 134–144)
Total Protein: 6.9 g/dL (ref 6.0–8.5)
eGFR: 79 mL/min/{1.73_m2} (ref >59)

## 2023-11-01 LAB — LIPID PANEL
Chol/HDL Ratio: 2.5 ratio (ref 0.0–4.4)
Cholesterol, Total: 165 mg/dL (ref 100–199)
HDL: 65 mg/dL (ref >39)
LDL Chol Calc (NIH): 84 mg/dL (ref 0–99)
Triglycerides: 87 mg/dL (ref 0–149)
VLDL Cholesterol Cal: 16 mg/dL (ref 5–40)

## 2023-11-13 ENCOUNTER — Ambulatory Visit: Payer: Self-pay | Admitting: Family Medicine

## 2024-01-02 ENCOUNTER — Ambulatory Visit

## 2024-01-02 VITALS — Ht 62.0 in | Wt 157.0 lb

## 2024-01-02 DIAGNOSIS — Z1231 Encounter for screening mammogram for malignant neoplasm of breast: Secondary | ICD-10-CM

## 2024-01-02 DIAGNOSIS — Z780 Asymptomatic menopausal state: Secondary | ICD-10-CM

## 2024-01-02 DIAGNOSIS — Z0000 Encounter for general adult medical examination without abnormal findings: Secondary | ICD-10-CM

## 2024-01-02 NOTE — Progress Notes (Signed)
 Subjective:   Teresa Quinn is a 78 y.o. who presents for a Medicare Wellness preventive visit.  As a reminder, Annual Wellness Visits don't include a physical exam, and some assessments may be limited, especially if this visit is performed virtually. We may recommend an in-person follow-up visit with your provider if needed.  Visit Complete: Virtual I connected with  Teresa Quinn on 01/02/24 by a audio enabled telemedicine application and verified that I am speaking with the correct person using two identifiers.  Patient Location: Home  Provider Location: Home Office  I discussed the limitations of evaluation and management by telemedicine. The patient expressed understanding and agreed to proceed.  Vital Signs: Because this visit was a virtual/telehealth visit, some criteria may be missing or patient reported. Any vitals not documented were not able to be obtained and vitals that have been documented are patient reported.  VideoDeclined- This patient declined Librarian, academic. Therefore the visit was completed with audio only.  Persons Participating in Visit: Patient.  AWV Questionnaire: Yes: Patient Medicare AWV questionnaire was completed by the patient on 12/29/2023; I have confirmed that all information answered by patient is correct and no changes since this date.  Cardiac Risk Factors include: advanced age (>73men, >20 women);dyslipidemia;hypertension     Objective:    Today's Vitals   01/02/24 1008  Weight: 157 lb (71.2 kg)  Height: 5' 2 (1.575 m)   Body mass index is 28.72 kg/m.     01/02/2024   10:07 AM 10/01/2022    2:50 PM 10/27/2021    7:23 PM 09/28/2021    2:58 PM 09/27/2020    4:41 PM 09/03/2019    8:15 AM  Advanced Directives  Does Patient Have a Medical Advance Directive? No Yes No Yes Yes Yes  Type of Furniture conservator/restorer;Living will  Healthcare Power of [place];Living will Healthcare Power  of [place];Living will Living will  Does patient want to make changes to medical advance directive?      No - Patient declined  Copy of Healthcare Power of Attorney in Chart?  No - copy requested  No - copy requested No - copy requested   Would patient like information on creating a medical advance directive? No - Patient declined     No - Patient declined    Current Medications (verified) Outpatient Encounter Medications as of 01/02/2024  Medication Sig   aspirin EC 81 MG tablet Take 81 mg by mouth daily.   atorvastatin  (LIPITOR) 40 MG tablet Take 1 tablet (40 mg total) by mouth daily.   Cyanocobalamin (VITAMIN B 12 PO) Take by mouth.   hydrochlorothiazide  (HYDRODIURIL ) 25 MG tablet Take 1 tablet (25 mg total) by mouth daily.   linaclotide  (LINZESS ) 72 MCG capsule Take 1 capsule (72 mcg total) by mouth daily before breakfast.   loratadine (CLARITIN) 10 MG tablet Take 10 mg by mouth daily.   metoprolol  succinate (TOPROL -XL) 25 MG 24 hr tablet Take 1 tablet (25 mg total) by mouth daily.   Multiple Vitamin (MULTIVITAMIN) tablet Take 1 tablet by mouth daily.   Omega-3 Fatty Acids (FISH OIL) 1000 MG CAPS Take by mouth.   No facility-administered encounter medications on file as of 01/02/2024.    Allergies (verified) Elemental sulfur   History: Past Medical History:  Diagnosis Date   Hyperlipidemia    Hypertension    Past Surgical History:  Procedure Laterality Date   ABDOMINAL HYSTERECTOMY  1991   Family History  Problem Relation Age of Onset   Hypertension Mother    Hyperlipidemia Father    Hypertension Father    Cancer Sister    Breast cancer Sister    Cancer - Colon Neg Hx    Social History   Socioeconomic History   Marital status: Widowed    Spouse name: Not on file   Number of children: 1   Years of education: Not on file   Highest education level: 12th grade  Occupational History   Occupation: Realtor  Tobacco Use   Smoking status: Never   Smokeless tobacco:  Never  Substance and Sexual Activity   Alcohol use: No   Drug use: No   Sexual activity: Never  Other Topics Concern   Not on file  Social History Narrative   Have one son who lives in [place]   She is still working full time - Veterinary surgeon   Social Drivers of Corporate investment banker Strain: Low Risk  (01/02/2024)   Overall Financial Resource Strain (CARDIA)    Difficulty of Paying Living Expenses: Not hard at all  Food Insecurity: No Food Insecurity (01/02/2024)   Hunger Vital Sign    Worried About Running Out of Food in the Last Year: Never true    Ran Out of Food in the Last Year: Never true  Transportation Needs: No Transportation Needs (01/02/2024)   PRAPARE - Administrator, Civil Service (Medical): No    Lack of Transportation (Non-Medical): No  Physical Activity: Sufficiently Active (01/02/2024)   Exercise Vital Sign    Days of Exercise per Week: 7 days    Minutes of Exercise per Session: 30 min  Recent Concern: Physical Activity - Insufficiently Active (10/30/2023)   Exercise Vital Sign    Days of Exercise per Week: 3 days    Minutes of Exercise per Session: 30 min  Stress: No Stress Concern Present (01/02/2024)   [employer] of Occupational Health - Occupational Stress Questionnaire    Feeling of Stress: Not at all  Social Connections: Moderately Integrated (01/02/2024)   Social Connection and Isolation Panel    Frequency of Communication with Friends and Family: More than three times a week    Frequency of Social Gatherings with Friends and Family: More than three times a week    Attends Religious Services: More than 4 times per year    Active Member of [employer] or Organizations: Yes    Attends Banker Meetings: More than 4 times per year    Marital Status: Widowed    Tobacco Counseling Counseling given: Not Answered    Clinical Intake:  Pre-visit preparation completed: Yes  Pain : No/denies pain     BMI - recorded: 28.72 Nutritional  Status: BMI 25 -29 Overweight Nutritional Risks: None Diabetes: No  No results found for: HGBA1C   How often do you need to have someone help you when you read instructions, pamphlets, or other written materials from your doctor or pharmacy?: 1 - Never  Interpreter Needed?: No  Information entered by :: Yosmar Ryker W CMA (AAMA)   Activities of Daily Living     12/29/2023    9:17 AM  In your present state of health, do you have any difficulty performing the following activities:  Hearing? 0  Vision? 0  Difficulty concentrating or making decisions? 0  Walking or climbing stairs? 0  Dressing or bathing? 0  Doing errands, shopping? 0  Preparing Food and eating ? N  Using the Toilet?  N  In the past six months, have you accidently leaked urine? N  Do you have problems with loss of bowel control? N  Managing your Medications? N  Managing your Finances? N  Housekeeping or managing your Housekeeping? N    Patient Care Team: Dettinger, Fonda LABOR, MD as PCP - General (Family Medicine) Ladora Ross Lacy Phebe, MD as Referring Physician (Optometry) Junior, Emmit Ivanoff, MD as Referring Physician (Dermatology)  I have updated your Care Teams any recent Medical Services you may have received from other providers in the past year.     Assessment:   This is a routine wellness examination for [facility].  Hearing/Vision screen Hearing Screening - Comments:: Patient denies any hearing difficulties.   Vision Screening - Comments:: Wears rx glasses - up to date with routine eye exams with  Dr. Charlett [facility]   Goals Addressed               This Visit's Progress     Exercise 3x per week (30 min per time)   On track     Quality of Life Maintained (pt-stated)   On track     I want to sell 10 or more houses and continue to remain active.        Depression Screen     01/02/2024   10:13 AM 10/31/2023    9:07 AM 05/01/2023   10:11 AM 10/01/2022    2:49 PM 09/04/2022    1:09 PM 03/01/2022     1:03 PM 09/28/2021    2:56 PM  PHQ 2/9 Scores  PHQ - 2 Score 0 0 0 0 0 0 0  PHQ- 9 Score 0  0 0  0 0    Fall Risk     12/29/2023    9:17 AM 10/31/2023    9:07 AM 05/01/2023   10:11 AM 10/01/2022    2:48 PM 09/04/2022    1:09 PM  Fall Risk   Falls in the past year? 0 0 0 0 0  Number falls in past yr: 0 0  0   Injury with Fall? 0 0  0   Risk for fall due to : No Fall Risks No Fall Risks  No Fall Risks   Follow up Falls evaluation completed;Education provided;Falls prevention discussed Falls evaluation completed  Falls prevention discussed     MEDICARE RISK AT HOME:  Medicare Risk at Home Any stairs in or around the home?: (Patient-Rptd) Yes If so, are there any without handrails?: (Patient-Rptd) No Home free of loose throw rugs in walkways, pet beds, electrical cords, etc?: (Patient-Rptd) Yes Adequate lighting in your home to reduce risk of falls?: (Patient-Rptd) Yes Life alert?: (Patient-Rptd) No Use of a cane, walker or w/c?: (Patient-Rptd) No Grab bars in the bathroom?: (Patient-Rptd) Yes Shower chair or bench in shower?: (Patient-Rptd) Yes Elevated toilet seat or a handicapped toilet?: (Patient-Rptd) Yes  TIMED UP AND GO:  Was the test performed?  No  Cognitive Function: 6CIT completed        01/02/2024   10:12 AM 10/01/2022    2:50 PM 09/28/2021    2:59 PM 09/03/2019    8:12 AM  6CIT Screen  What Year? 0 points 0 points 0 points 0 points  What month? 0 points 0 points 0 points 0 points  What time? 0 points 0 points 0 points 0 points  Count back from 20 0 points 0 points 0 points 0 points  Months in reverse 0 points 0 points  0 points 0 points  Repeat phrase 0 points 0 points 2 points 0 points  Total Score 0 points 0 points 2 points 0 points    Immunizations Immunization History  Administered Date(s) Administered   Fluad Quad(high Dose 65+) 02/17/2019, 03/03/2020, 05/16/2022   Influenza, High Dose Seasonal PF 02/25/2017, 02/24/2018, 06/02/2023    Influenza,inj,Quad PF,6+ Mos 02/11/2016   Moderna Sars-Covid-2 Vaccination 07/15/2019, 08/20/2019, 04/14/2020   Pneumococcal Conjugate-13 03/01/2014   Pneumococcal Polysaccharide-23 01/20/2019   Tdap 05/01/2023   Zoster Recombinant(Shingrix ) 08/30/2021, 03/01/2022    Screening Tests Health Maintenance  Topic Date Due   COVID-19 Vaccine (4 - 2024-25 season) 01/12/2023   INFLUENZA VACCINE  12/12/2023   DEXA SCAN  04/30/2024 (Originally 03/15/2023)   MAMMOGRAM  05/01/2024 (Originally 09/04/2023)   Medicare Annual Wellness (AWV)  01/01/2025   DTaP/Tdap/Td (2 - Td or Tdap) 04/30/2033   Pneumococcal Vaccine: 50+ Years  Completed   Hepatitis C Screening  Completed   Zoster Vaccines- Shingrix   Completed   HPV VACCINES  Aged Out   Meningococcal B Vaccine  Aged Out   Colonoscopy  Discontinued    Health Maintenance  Health Maintenance Due  Topic Date Due   COVID-19 Vaccine (4 - 2024-25 season) 01/12/2023   INFLUENZA VACCINE  12/12/2023   Health Maintenance Items Addressed: Mammogram scheduled, DEXA ordered  Additional Screening:  Vision Screening: Recommended annual ophthalmology exams for early detection of glaucoma and other disorders of the eye. Would you like a referral to an eye doctor? No    Dental Screening: Recommended annual dental exams for proper oral hygiene  Community Resource Referral / Chronic Care Management: CRR required this visit?  No   CCM required this visit?  No   Plan:    I have personally reviewed and noted the following in the patient's chart:   Medical and social history Use of alcohol, tobacco or illicit drugs  Current medications and supplements including opioid prescriptions. Patient is not currently taking opioid prescriptions. Functional ability and status Nutritional status Physical activity Advanced directives List of other physicians Hospitalizations, surgeries, and ER visits in previous 12 months Vitals Screenings to include  cognitive, depression, and falls Referrals and appointments  In addition, I have reviewed and discussed with patient certain preventive protocols, quality metrics, and best practice recommendations. A written personalized care plan for preventive services as well as general preventive health recommendations were provided to patient.   Azriella Mattia, CMA   01/02/2024   After Visit Summary: (MyChart) Due to this being a telephonic visit, the after visit summary with patients personalized plan was offered to patient via MyChart   Notes: Nothing significant to report at this time.

## 2024-01-02 NOTE — Patient Instructions (Signed)
 [name] , Thank you for taking time out of your busy schedule to complete your Annual Wellness Visit with me. I enjoyed our conversation and look forward to speaking with you again next year. I, as well as your care team,  appreciate your ongoing commitment to your health goals. Please review the following plan we discussed and let me know if I can assist you in the future.  Your Game plan/ To Do List  Referrals: If you haven't heard from the office you've been referred to, please reach out to them at the phone provided.  Mammogram Appt: January 19, 2024 at 10:10 am on the [employer] at [facility]. Remember no lotions, powders, or perfumes the morning of your mammogram.  Dexa Scan. I will send a message to radiology asking them to contact you to set that appointment up for you.  Follow up Visits: We will see or speak with you next year for your Next Medicare AWV with our clinical staff  Clinician Recommendations:  Aim for 30 minutes of exercise or brisk walking, 6-8 glasses of water, and 5 servings of fruits and vegetables each day.    Wishing you many blessings and good health during the next year until our next visit.  -Zeanna Sunde   This is a list of the screenings recommended for you:  Health Maintenance  Topic Date Due   COVID-19 Vaccine (4 - 2024-25 season) 01/12/2023   Flu Shot  12/12/2023   DEXA scan (bone density measurement)  04/30/2024*   Mammogram  05/01/2024*   Medicare Annual Wellness Visit  01/01/2025   DTaP/Tdap/Td vaccine (2 - Td or Tdap) 04/30/2033   Pneumococcal Vaccine for age over 30  Completed   Hepatitis C Screening  Completed   Zoster (Shingles) Vaccine  Completed   HPV Vaccine  Aged Out   Meningitis B Vaccine  Aged Out   Colon Cancer Screening  Discontinued  *Topic was postponed. The date shown is not the original due date.    Advanced directives: (Declined) Advance directive discussed with you today. Even though you declined this today, please call our  office should you change your mind, and we can give you the proper paperwork for you to fill out. Advance Care Planning is important because it:  [x]  Makes sure you receive the medical care that is consistent with your values, goals, and preferences  [x]  It provides guidance to your family and loved ones and reduces their decisional burden about whether or not they are making the right decisions based on your wishes.  Follow the link provided in your after visit summary or read over the paperwork we have mailed to you to help you started getting your Advance Directives in place. If you need assistance in completing these, please reach out to us  so that we can help you!  See attachments for Preventive Care and Fall Prevention Tips.

## 2024-01-19 ENCOUNTER — Other Ambulatory Visit: Payer: Self-pay | Admitting: Family Medicine

## 2024-01-19 ENCOUNTER — Ambulatory Visit
Admission: RE | Admit: 2024-01-19 | Discharge: 2024-01-19 | Disposition: A | Discharge status: Home / Self Care | Source: Ambulatory Visit | Attending: Family Medicine

## 2024-01-19 ENCOUNTER — Ambulatory Visit (INDEPENDENT_AMBULATORY_CARE_PROVIDER_SITE_OTHER)

## 2024-01-19 DIAGNOSIS — Z13820 Encounter for screening for osteoporosis: Secondary | ICD-10-CM

## 2024-01-19 DIAGNOSIS — Z1231 Encounter for screening mammogram for malignant neoplasm of breast: Secondary | ICD-10-CM

## 2024-01-19 DIAGNOSIS — Z780 Asymptomatic menopausal state: Secondary | ICD-10-CM

## 2024-01-22 DIAGNOSIS — M8589 Other specified disorders of bone density and structure, multiple sites: Secondary | ICD-10-CM | POA: Diagnosis not present

## 2024-01-22 DIAGNOSIS — Z780 Asymptomatic menopausal state: Secondary | ICD-10-CM | POA: Diagnosis not present

## 2024-01-26 ENCOUNTER — Ambulatory Visit: Payer: Self-pay | Admitting: Family Medicine

## 2024-05-10 ENCOUNTER — Encounter: Payer: Self-pay | Admitting: Family Medicine

## 2024-05-10 ENCOUNTER — Ambulatory Visit: Payer: Self-pay | Admitting: Family Medicine

## 2024-05-10 VITALS — BP 138/77 | HR 83 | Ht 62.0 in | Wt 153.0 lb

## 2024-05-10 DIAGNOSIS — E782 Mixed hyperlipidemia: Secondary | ICD-10-CM | POA: Diagnosis not present

## 2024-05-10 DIAGNOSIS — K581 Irritable bowel syndrome with constipation: Secondary | ICD-10-CM

## 2024-05-10 DIAGNOSIS — I10 Essential (primary) hypertension: Secondary | ICD-10-CM | POA: Diagnosis not present

## 2024-05-10 LAB — CMP14+EGFR
ALT: 26 IU/L (ref 0–32)
AST: 41 IU/L — ABNORMAL HIGH (ref 0–40)
Albumin: 4.6 g/dL (ref 3.8–4.8)
Alkaline Phosphatase: 73 IU/L (ref 49–135)
BUN/Creatinine Ratio: 22 (ref 12–28)
BUN: 17 mg/dL (ref 8–27)
Bilirubin Total: 0.5 mg/dL (ref 0.0–1.2)
CO2: 22 mmol/L (ref 20–29)
Calcium: 9.8 mg/dL (ref 8.7–10.3)
Chloride: 97 mmol/L (ref 96–106)
Creatinine, Ser: 0.77 mg/dL (ref 0.57–1.00)
Globulin, Total: 2.2 g/dL (ref 1.5–4.5)
Glucose: 79 mg/dL (ref 70–99)
Potassium: 4.2 mmol/L (ref 3.5–5.2)
Sodium: 135 mmol/L (ref 134–144)
Total Protein: 6.8 g/dL (ref 6.0–8.5)
eGFR: 79 mL/min/1.73

## 2024-05-10 LAB — CBC WITH DIFFERENTIAL/PLATELET
Basophils Absolute: 0 x10E3/uL (ref 0.0–0.2)
Basos: 1 %
EOS (ABSOLUTE): 0.1 x10E3/uL (ref 0.0–0.4)
Eos: 2 %
Hematocrit: 40.4 % (ref 34.0–46.6)
Hemoglobin: 13.3 g/dL (ref 11.1–15.9)
Immature Grans (Abs): 0 x10E3/uL (ref 0.0–0.1)
Immature Granulocytes: 0 %
Lymphocytes Absolute: 1.3 x10E3/uL (ref 0.7–3.1)
Lymphs: 25 %
MCH: 32 pg (ref 26.6–33.0)
MCHC: 32.9 g/dL (ref 31.5–35.7)
MCV: 97 fL (ref 79–97)
Monocytes Absolute: 0.4 x10E3/uL (ref 0.1–0.9)
Monocytes: 8 %
Neutrophils Absolute: 3.5 x10E3/uL (ref 1.4–7.0)
Neutrophils: 64 %
Platelets: 228 x10E3/uL (ref 150–450)
RBC: 4.15 x10E6/uL (ref 3.77–5.28)
RDW: 12 % (ref 11.7–15.4)
WBC: 5.4 x10E3/uL (ref 3.4–10.8)

## 2024-05-10 LAB — LIPID PANEL
Chol/HDL Ratio: 2.3 ratio (ref 0.0–4.4)
Cholesterol, Total: 164 mg/dL (ref 100–199)
HDL: 70 mg/dL
LDL Chol Calc (NIH): 80 mg/dL (ref 0–99)
Triglycerides: 71 mg/dL (ref 0–149)
VLDL Cholesterol Cal: 14 mg/dL (ref 5–40)

## 2024-05-10 LAB — TSH: TSH: 2.42 u[IU]/mL (ref 0.450–4.500)

## 2024-05-10 NOTE — Progress Notes (Signed)
 "  BP 138/77   Pulse 83   Ht 5' 2 (1.575 m)   Wt 153 lb (69.4 kg)   SpO2 96%   BMI 27.98 kg/m    Subjective:   Patient ID: Teresa Quinn, female    DOB: 1946-03-04, 78 y.o.   MRN: [id number]  HPI: Teresa Quinn is a 78 y.o. female presenting on 05/10/2024 for Medical Management of Chronic Issues, Hyperlipidemia, and Hypertension   Discussed the use of AI scribe software for clinical note transcription with the patient, who gave verbal consent to proceed.  History of Present Illness   Teresa Quinn is a 78 year old female with hypertension and hyperlipidemia who presents for a recheck of her blood pressure and cholesterol management.  Hypertension - Home blood pressure readings generally in the 130s/70s range, with occasional lower readings - Currently taking hydrochlorothiazide  and metoprolol  for blood pressure control  Hyperlipidemia - Currently taking atorvastatin  and fish oils for cholesterol management - No issues with current cholesterol medications  Bowel regularity - Takes Linzess  72 mcg for bowel regularity - Medication helps maintain regular bowel movements - Times medication intake to optimize effectiveness          Relevant past medical, surgical, family and social history reviewed and updated as indicated. Interim medical history since our last visit reviewed. Allergies and medications reviewed and updated.  Review of Systems  Constitutional:  Negative for chills and fever.  HENT:  Negative for congestion, ear discharge and ear pain.   Eyes:  Negative for redness and visual disturbance.  Respiratory:  Negative for chest tightness and shortness of breath.   Cardiovascular:  Negative for chest pain and leg swelling.  Genitourinary:  Negative for difficulty urinating and dysuria.  Musculoskeletal:  Negative for back pain and gait problem.  Skin:  Negative for rash.  Neurological:  Negative for dizziness, light-headedness and headaches.   Psychiatric/Behavioral:  Negative for agitation and behavioral problems.   All other systems reviewed and are negative.   Per HPI unless specifically indicated above   Allergies as of 05/10/2024       Reactions   Elemental Sulfur Rash        Medication List        Accurate as of May 10, 2024  9:39 AM. If you have any questions, ask your nurse or doctor.          aspirin EC 81 MG tablet Take 81 mg by mouth daily.   atorvastatin  40 MG tablet Commonly known as: LIPITOR Take 1 tablet (40 mg total) by mouth daily.   Fish Oil 1000 MG Caps Take by mouth.   hydrochlorothiazide  25 MG tablet Commonly known as: HYDRODIURIL  Take 1 tablet (25 mg total) by mouth daily.   linaclotide  72 MCG capsule Commonly known as: Linzess  Take 1 capsule (72 mcg total) by mouth daily before breakfast.   loratadine 10 MG tablet Commonly known as: CLARITIN Take 10 mg by mouth daily.   metoprolol  succinate 25 MG 24 hr tablet Commonly known as: TOPROL -XL Take 1 tablet (25 mg total) by mouth daily.   multivitamin tablet Take 1 tablet by mouth daily.   VITAMIN B 12 PO Take by mouth.         Objective:   BP 138/77   Pulse 83   Ht 5' 2 (1.575 m)   Wt 153 lb (69.4 kg)   SpO2 96%   BMI 27.98 kg/m   Wt Readings from Last 3 Encounters:  05/10/24 153 lb (69.4 kg)  01/02/24 157 lb (71.2 kg)  10/31/23 157 lb (71.2 kg)    Physical Exam Physical Exam   VITALS: BP- 138/79 NECK: Thyroid  normal, no lumps or swelling. CHEST: Lungs clear to auscultation bilaterally. CARDIOVASCULAR: Heart regular rate and rhythm, no murmurs. Pulses normal.         Assessment & Plan:   Problem List Items Addressed This Visit       Cardiovascular and Mediastinum   Essential hypertension - Primary   Relevant Orders   CBC with Differential/Platelet   CMP14+EGFR   Lipid panel   TSH     Digestive   Irritable bowel syndrome   Relevant Orders   CBC with Differential/Platelet    CMP14+EGFR   Lipid panel   TSH     Other   Hyperlipidemia   Relevant Orders   CBC with Differential/Platelet   CMP14+EGFR   Lipid panel   TSH       Essential hypertension Blood pressure controlled with current medications. - Continue hydrochlorothiazide  and metoprolol .  Mixed hyperlipidemia Managed with atorvastatin  and fish oils. No issues reported. - Continue atorvastatin  and fish oils. - Ordered blood work to assess cholesterol levels.  Irritable bowel syndrome with constipation Symptoms managed with Linzess  72 mcg. Regular bowel movements. - Continue Linzess  at current dose.  General health maintenance Encouraged active lifestyle for overall health. - Encouraged regular physical activity.          Follow up plan: Return in about 6 months (around 11/08/2024), or if symptoms worsen or fail to improve, for Physical exam and hypertension and hyperlipidemia.  Counseling provided for all of the vaccine components Orders Placed This Encounter  Procedures   CBC with Differential/Platelet   CMP14+EGFR   Lipid panel   TSH    Fonda Levins, MD [facility] 05/10/2024, 9:39 AM     "

## 2024-05-14 ENCOUNTER — Ambulatory Visit: Payer: Self-pay | Admitting: Family Medicine

## 2024-11-08 ENCOUNTER — Encounter: Admitting: Family Medicine

## 2025-01-03 ENCOUNTER — Ambulatory Visit

## 2025-01-04 ENCOUNTER — Ambulatory Visit: Payer: Self-pay
